# Patient Record
Sex: Female | Born: 1995 | Race: White | Hispanic: No | Marital: Married | Smoking: Never smoker
Health system: Southern US, Community
[De-identification: ages and names within clinical notes are randomized; demographics above are authoritative.]

---

## 2017-03-12 ENCOUNTER — Other Ambulatory Visit: Payer: Self-pay | Admitting: Family Medicine

## 2017-03-12 DIAGNOSIS — R519 Headache, unspecified: Secondary | ICD-10-CM

## 2017-03-12 DIAGNOSIS — R51 Headache: Principal | ICD-10-CM

## 2017-03-28 ENCOUNTER — Other Ambulatory Visit: Payer: Self-pay

## 2017-06-26 ENCOUNTER — Other Ambulatory Visit: Payer: Self-pay | Admitting: Family Medicine

## 2017-06-26 DIAGNOSIS — N912 Amenorrhea, unspecified: Secondary | ICD-10-CM

## 2017-07-03 ENCOUNTER — Ambulatory Visit
Admission: RE | Admit: 2017-07-03 | Discharge: 2017-07-03 | Disposition: A | Discharge status: Home / Self Care | Payer: 59 | Source: Ambulatory Visit | Attending: Family Medicine | Admitting: Family Medicine

## 2017-07-03 DIAGNOSIS — N912 Amenorrhea, unspecified: Secondary | ICD-10-CM

## 2017-10-07 ENCOUNTER — Other Ambulatory Visit: Payer: Self-pay | Admitting: Family Medicine

## 2017-10-07 DIAGNOSIS — N83209 Unspecified ovarian cyst, unspecified side: Secondary | ICD-10-CM

## 2017-10-24 ENCOUNTER — Ambulatory Visit
Admission: RE | Admit: 2017-10-24 | Discharge: 2017-10-24 | Disposition: A | Discharge status: Home / Self Care | Payer: 59 | Source: Ambulatory Visit | Attending: Family Medicine | Admitting: Family Medicine

## 2017-10-24 DIAGNOSIS — N83209 Unspecified ovarian cyst, unspecified side: Secondary | ICD-10-CM

## 2019-01-14 ENCOUNTER — Other Ambulatory Visit: Payer: Self-pay

## 2019-01-14 ENCOUNTER — Encounter (HOSPITAL_COMMUNITY): Payer: Self-pay

## 2019-01-14 ENCOUNTER — Emergency Department (HOSPITAL_COMMUNITY)
Admission: EM | Admit: 2019-01-14 | Discharge: 2019-01-14 | Disposition: A | Discharge status: Home / Self Care | Payer: 59 | Attending: Emergency Medicine | Admitting: Emergency Medicine

## 2019-01-14 ENCOUNTER — Emergency Department (HOSPITAL_COMMUNITY): Payer: 59

## 2019-01-14 DIAGNOSIS — R079 Chest pain, unspecified: Secondary | ICD-10-CM | POA: Insufficient documentation

## 2019-01-14 DIAGNOSIS — Z5321 Procedure and treatment not carried out due to patient leaving prior to being seen by health care provider: Secondary | ICD-10-CM | POA: Diagnosis not present

## 2019-01-14 LAB — TROPONIN I (HIGH SENSITIVITY): Troponin I (High Sensitivity): <2 ng/L (ref <18)

## 2019-01-14 LAB — CBC
HCT: 41.4 % (ref 36.0–46.0)
Hemoglobin: 13.4 g/dL (ref 12.0–15.0)
MCH: 29.6 pg (ref 26.0–34.0)
MCHC: 32.4 g/dL (ref 30.0–36.0)
MCV: 91.6 fL (ref 80.0–100.0)
Platelets: 349 10*3/uL (ref 150–400)
RBC: 4.52 MIL/uL (ref 3.87–5.11)
RDW: 12.6 % (ref 11.5–15.5)
WBC: 9.3 10*3/uL (ref 4.0–10.5)
nRBC: 0 % (ref 0.0–0.2)

## 2019-01-14 LAB — BASIC METABOLIC PANEL
Anion gap: 8 (ref 5–15)
BUN: 11 mg/dL (ref 6–20)
CO2: 24 mmol/L (ref 22–32)
Calcium: 9.1 mg/dL (ref 8.9–10.3)
Chloride: 108 mmol/L (ref 98–111)
Creatinine, Ser: 0.67 mg/dL (ref 0.44–1.00)
GFR calc Af Amer: >60 mL/min (ref >60)
GFR calc non Af Amer: >60 mL/min (ref >60)
Glucose, Bld: 81 mg/dL (ref 70–99)
Potassium: 3.9 mmol/L (ref 3.5–5.1)
Sodium: 140 mmol/L (ref 135–145)

## 2019-01-14 LAB — I-STAT BETA HCG BLOOD, ED (MC, WL, AP ONLY): I-stat hCG, quantitative: <5 m[IU]/mL (ref <5)

## 2019-01-14 MED ORDER — SODIUM CHLORIDE 0.9% FLUSH: 3.0000 mL | Freq: Once | INTRAVENOUS | Status: DC

## 2019-01-14 NOTE — ED Triage Notes (Signed)
Pt reports central to right sided chest pressure, radiates to her back that started this morning. Worse with deep inspiration. No medical hx. Resp e/u

## 2019-01-14 NOTE — ED Notes (Signed)
Pt stated asked about wait and results.  I mentioned that there was nothing emergent about her results.  Pt decided to leve.

## 2021-07-25 ENCOUNTER — Telehealth: Payer: Self-pay | Admitting: Family Medicine

## 2021-07-25 NOTE — Telephone Encounter (Signed)
Okay to accept.

## 2021-07-25 NOTE — Telephone Encounter (Signed)
Okay to see pt?

## 2021-07-25 NOTE — Telephone Encounter (Signed)
Patient would like to know if it's okay for her to establish care as a new patient with [facility]? Patient's mom (MRN: [id number]) and Lurline Del (MRN: [id number]) see's [facility].

## 2021-07-29 NOTE — Progress Notes (Addendum)
[facility] [address] [place], [place] [address] [phone]  Date:  08/01/2021   Name:  Jennifer Yoder   DOB:  [birth year]   MRN:  [id number]  PCP:  Pearline Cables, MD    Chief Complaint: New Patient (Initial Visit) (Concerns/ questions: 1. Pt says she has not feeling herself since christmas. 2. Seeing eye drs for elevated eye pressure. /HPV: id not receive /HIV screen/ Hep C screen/Tdap- utd per pt/Flu- has not received/Pap- sees gyn)   History of Present Illness:  Jennifer Yoder is a 26 y.o. very pleasant female patient who presents with the following:  New patient seen today to establish care Her mother and grandmother are patients of mine  She was seen by her eye doctor recently and was told that she had possibly increased intracranial pressure. 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[employer]-they examined her on February 9 and noted findings consistent with papilledema  She previously went to urgent care in January due to current symptoms and was told that perhaps she might have an ear infection- they treated her with abx and pred, no help  She has noted a headache and feels off balance She has noted this for a couple of months-this is why she went to urgent care  She may feel like she gets a hot flash off and on   She does see GYN for her pap   She is being seen by [facility] in 2 days    She is engaged to be married- they are thinking of getting married next fall She enjoys cooking, being with her fianc 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 She had a bleed at the beginning of this month but it was only 2-3 days  Results for orders placed or performed in visit on 08/01/21  POCT urine pregnancy  Result Value Ref Range   Preg Test, Ur Negative Negative     There are no problems to display  for this patient.   No past medical history on file.  No past surgical history on file.  Social History   Tobacco Use   Smoking status: Never   Smokeless tobacco: Never  Substance Use Topics   Alcohol use: Not Currently   Drug use: Never    No family history on file.  Not on File  Medication list has been reviewed and updated.  No current outpatient medications on file prior to visit.   No current facility-administered medications on file prior to visit.    Review of Systems:  As per HPI- otherwise negative.   Physical Examination: Vitals:   08/01/21 1037  BP: 102/62  Pulse: (!) 102  Resp: 18  Temp: 97.7 F (36.5 C)  SpO2: 99%   Vitals:   08/01/21 1037  Weight: (!) 320 lb (145.2 kg)  Height: 5\' 2"  (1.575 m)   Body mass index is 58.53 kg/m. Ideal Body Weight: Weight in (lb) to have BMI = 25: 136.4  GEN: no acute distress.  Obese, otherwise looks well HEENT: Atraumatic, Normocephalic.  Bilateral TM wnl, oropharynx normal.  PEERL,EOMI.   Ears and Nose: No external deformity. CV: RRR, No M/G/R. No JVD. No thrill. No extra heart sounds. PULM: CTA B, no wheezes, crackles, rhonchi. No retractions. No resp. distress. No accessory muscle use.  ABD: S, NT, ND, +BS. No rebound. No HSM. EXTR: No c/c/e PSYCH: Normally interactive. Conversant.   Results for orders placed or performed in visit on 08/01/21  POCT urine pregnancy  Result Value Ref Range   Preg Test, Ur Negative Negative    Assessment and Plan: Papilledema - Plan: Ambulatory referral to Neurology, MR Brain W Wo Contrast  Irregular menses - Plan: POCT urine pregnancy  Screening for hyperlipidemia - Plan: Lipid panel  Screening, deficiency anemia, iron - Plan: CBC  Screening for diabetes mellitus - Plan: Comprehensive metabolic panel, Hemoglobin A1c  Fatigue, unspecified type - Plan: TSH, VITAMIN D 25 Hydroxy (Vit-D Deficiency, Fractures)  Encounter for initial prescription of contraceptive  pills  New daily persistent headache - Plan: MR Brain W Wo Contrast  Change in vision - Plan: MR Brain W Wo Contrast  Patient seen today to establish care.  She has noticed symptoms of headache, vision change and generally not feeling great for a couple of months.  She was recently seen by optometry and noted to have possible papilledema.  We are concerned this could indicate idiopathic intracranial hypertension  I have made a referral to neurology, ordered an MRI of her brain  Routine lab work otherwise pending as above  She is also interested in contraception-we discussed use of hormonal contraceptives while we are working up possible IIH.  Spoke with pharmD- POP for contraception is a reasonable choice and safer than pregnancy for her at this time   We will send patient a message with this information, will offer progesterone only pill  Signed Abbe Amsterdam, MD  Addendum 2/16, received labs as below.  Message to patient Results for orders placed or performed in visit on 08/01/21  CBC  Result Value Ref Range   WBC 6.6 4.0 - 10.5 K/uL   RBC 4.45 3.87 - 5.11 Mil/uL   Platelets 303.0 150.0 - 400.0 K/uL   Hemoglobin 13.3 12.0 - 15.0 g/dL   HCT 16.1 09.6 - 04.5 %   MCV 89.5 78.0 - 100.0 fl   MCHC 33.3 30.0 - 36.0 g/dL   RDW 40.9 81.1 - 91.4 %  Comprehensive metabolic panel  Result Value Ref Range   Sodium 139 135 - 145 mEq/L   Potassium 4.3 3.5 - 5.1 mEq/L   Chloride 104 96 - 112 mEq/L   CO2 28 19 - 32 mEq/L   Glucose, Bld 111 (H) 70 - 99 mg/dL   BUN 12 6 - 23 mg/dL   Creatinine, Ser 7.82 0.40 - 1.20 mg/dL   Total Bilirubin 0.3 0.2 - 1.2 mg/dL   Alkaline Phosphatase 61 39 - 117 U/L   AST 15 0 - 37 U/L   ALT 15 0 - 35 U/L   Total Protein 7.8 6.0 - 8.3 g/dL   Albumin 4.6 3.5 - 5.2 g/dL   GFR 956.21 >30.86 mL/min   Calcium 9.4 8.4 - 10.5 mg/dL  Hemoglobin V7Q  Result Value Ref Range   Hgb A1c MFr Bld 5.5 4.6 - 6.5 %  Lipid panel  Result Value Ref Range   Cholesterol  172 0 - 200 mg/dL   Triglycerides 46.9 0.0 - 149.0 mg/dL   HDL 62.95 (L) >28.41 mg/dL   VLDL 32.4 0.0 - 40.1 mg/dL   LDL Cholesterol 027 (H) 0 - 99 mg/dL   Total CHOL/HDL Ratio 5    NonHDL 136.00   TSH  Result Value Ref Range   TSH 2.32 0.35 - 5.50 uIU/mL  VITAMIN D 25  Hydroxy (Vit-D Deficiency, Fractures)  Result Value Ref Range   VITD 15.62 (L) 30.00 - 100.00 ng/mL  POCT urine pregnancy  Result Value Ref Range   Preg Test, Ur Negative Negative

## 2021-08-01 ENCOUNTER — Encounter: Payer: Self-pay | Admitting: Family Medicine

## 2021-08-01 ENCOUNTER — Ambulatory Visit (INDEPENDENT_AMBULATORY_CARE_PROVIDER_SITE_OTHER): Payer: Managed Care, Other (non HMO) | Admitting: Family Medicine

## 2021-08-01 VITALS — BP 102/62 | HR 102 | Temp 97.7°F | Resp 18 | Ht 62.0 in | Wt 320.0 lb

## 2021-08-01 DIAGNOSIS — Z130 Encounter for screening for diseases of the blood and blood-forming organs and certain disorders involving the immune mechanism: Secondary | ICD-10-CM | POA: Diagnosis not present

## 2021-08-01 DIAGNOSIS — H4710 Unspecified papilledema: Secondary | ICD-10-CM

## 2021-08-01 DIAGNOSIS — E559 Vitamin D deficiency, unspecified: Secondary | ICD-10-CM

## 2021-08-01 DIAGNOSIS — H539 Unspecified visual disturbance: Secondary | ICD-10-CM

## 2021-08-01 DIAGNOSIS — Z131 Encounter for screening for diabetes mellitus: Secondary | ICD-10-CM

## 2021-08-01 DIAGNOSIS — G4452 New daily persistent headache (NDPH): Secondary | ICD-10-CM

## 2021-08-01 DIAGNOSIS — R5383 Other fatigue: Secondary | ICD-10-CM | POA: Diagnosis not present

## 2021-08-01 DIAGNOSIS — Z13220 Encounter for screening for lipoid disorders: Secondary | ICD-10-CM

## 2021-08-01 DIAGNOSIS — N926 Irregular menstruation, unspecified: Secondary | ICD-10-CM

## 2021-08-01 DIAGNOSIS — Z30011 Encounter for initial prescription of contraceptive pills: Secondary | ICD-10-CM

## 2021-08-01 LAB — CBC
HCT: 39.9 % (ref 36.0–46.0)
Hemoglobin: 13.3 g/dL (ref 12.0–15.0)
MCHC: 33.3 g/dL (ref 30.0–36.0)
MCV: 89.5 fl (ref 78.0–100.0)
Platelets: 303 10*3/uL (ref 150.0–400.0)
RBC: 4.45 Mil/uL (ref 3.87–5.11)
RDW: 13.9 % (ref 11.5–15.5)
WBC: 6.6 10*3/uL (ref 4.0–10.5)

## 2021-08-01 LAB — HEMOGLOBIN A1C: Hgb A1c MFr Bld: 5.5 % (ref 4.6–6.5)

## 2021-08-01 LAB — VITAMIN D 25 HYDROXY (VIT D DEFICIENCY, FRACTURES): VITD: 15.62 ng/mL — ABNORMAL LOW (ref 30.00–100.00)

## 2021-08-01 LAB — POCT URINE PREGNANCY: Preg Test, Ur: NEGATIVE

## 2021-08-01 LAB — TSH: TSH: 2.32 u[IU]/mL (ref 0.35–5.50)

## 2021-08-01 NOTE — Patient Instructions (Signed)
Good to meet you today!  Take care and I will be in touch with your labs asap I think we can use a non- estrogen birth control pill for you- however I would like to do a bit more reading and speak with the pharmacy first.  I will be in touch with you  Please let me know what Dr Baird Cancer says at your appt on Friday I will also touch base with neurology for you and make a referral

## 2021-08-02 ENCOUNTER — Encounter: Payer: Self-pay | Admitting: Family Medicine

## 2021-08-02 LAB — LIPID PANEL
Cholesterol: 172 mg/dL (ref 0–200)
HDL: 36.2 mg/dL — ABNORMAL LOW (ref >39.00)
LDL Cholesterol: 117 mg/dL — ABNORMAL HIGH (ref 0–99)
NonHDL: 136
Total CHOL/HDL Ratio: 5
Triglycerides: 97 mg/dL (ref 0.0–149.0)
VLDL: 19.4 mg/dL (ref 0.0–40.0)

## 2021-08-02 LAB — COMPREHENSIVE METABOLIC PANEL
ALT: 15 U/L (ref 0–35)
AST: 15 U/L (ref 0–37)
Albumin: 4.6 g/dL (ref 3.5–5.2)
Alkaline Phosphatase: 61 U/L (ref 39–117)
BUN: 12 mg/dL (ref 6–23)
CO2: 28 mEq/L (ref 19–32)
Calcium: 9.4 mg/dL (ref 8.4–10.5)
Chloride: 104 mEq/L (ref 96–112)
Creatinine, Ser: 0.65 mg/dL (ref 0.40–1.20)
GFR: 122.16 mL/min (ref >60.00)
Glucose, Bld: 111 mg/dL — ABNORMAL HIGH (ref 70–99)
Potassium: 4.3 mEq/L (ref 3.5–5.1)
Sodium: 139 mEq/L (ref 135–145)
Total Bilirubin: 0.3 mg/dL (ref 0.2–1.2)
Total Protein: 7.8 g/dL (ref 6.0–8.3)

## 2021-08-02 MED ORDER — VITAMIN D3 1.25 MG (50000 UT) PO CAPS: ORAL_CAPSULE | ORAL | 0 refills | Status: DC

## 2021-08-02 NOTE — Addendum Note (Signed)
Addended by: Lamar Blinks C on: 08/02/2021 11:01 AM   Modules accepted: Orders

## 2021-08-03 ENCOUNTER — Encounter: Payer: Self-pay | Admitting: Family Medicine

## 2021-08-03 DIAGNOSIS — H4710 Unspecified papilledema: Secondary | ICD-10-CM

## 2021-08-04 ENCOUNTER — Encounter: Payer: Self-pay | Admitting: Family Medicine

## 2021-08-04 NOTE — Telephone Encounter (Signed)
I called pt back yesterday- the ophthalmologist she saw indicate that she did have papilledema, but did not feel her vision was at immediate risk.  The MD recommended that she do a brain MRI and neurology referral which are  already in motion.   We also discussed getting an LP- explained that her LP "opening pressure" is essential to making the diagnosis of IIH.  As such, she would like to go ahead and get LP ordered which I have done.    Cautioned pt that is she is getting worse please go to the ER.  This evaluation will take some time as there are several steps- therefore if she starts to decline please seek immediate care.  She states understanding

## 2021-08-06 ENCOUNTER — Telehealth: Payer: Self-pay | Admitting: Family Medicine

## 2021-08-06 ENCOUNTER — Telehealth: Payer: Self-pay

## 2021-08-06 ENCOUNTER — Other Ambulatory Visit: Payer: Self-pay | Admitting: Family Medicine

## 2021-08-06 DIAGNOSIS — R519 Headache, unspecified: Secondary | ICD-10-CM

## 2021-08-06 NOTE — Telephone Encounter (Signed)
Authorization number: [id number]  Dates approved: 08/01/21 through 01/28/2022

## 2021-08-06 NOTE — Telephone Encounter (Signed)
-----   Message from Darreld Mclean, MD sent at 08/01/2021  8:18 PM EST ----- Checking with neurology,?  Start Diamox

## 2021-08-07 NOTE — Telephone Encounter (Signed)
Via fax 08/07/21:   Approval number: [id number]  Dates: 08/06/21 through 02/02/2022

## 2021-08-07 NOTE — Telephone Encounter (Signed)
Opened in error

## 2021-08-08 ENCOUNTER — Encounter: Payer: Self-pay | Admitting: Family Medicine

## 2021-08-08 NOTE — Telephone Encounter (Signed)
Called patient back, she was just surprised that her MRI estimate is $1200.  I advised her we can try a different facility such as [facility], might be less expensive.  However she really would like to go ahead and get her MRI done, plans to go through with it tomorrow  She also has a neurology appointment pending March 1

## 2021-08-08 NOTE — Telephone Encounter (Signed)
error 

## 2021-08-09 ENCOUNTER — Ambulatory Visit (HOSPITAL_COMMUNITY)
Admission: RE | Admit: 2021-08-09 | Discharge: 2021-08-09 | Disposition: A | Discharge status: Home / Self Care | Payer: Commercial Managed Care - HMO | Source: Ambulatory Visit | Attending: Family Medicine | Admitting: Family Medicine

## 2021-08-09 ENCOUNTER — Other Ambulatory Visit: Payer: Self-pay

## 2021-08-09 ENCOUNTER — Encounter: Payer: Self-pay | Admitting: Family Medicine

## 2021-08-09 DIAGNOSIS — R519 Headache, unspecified: Secondary | ICD-10-CM | POA: Insufficient documentation

## 2021-08-09 MED ORDER — AMOXICILLIN 500 MG PO CAPS: 1000.0000 mg | ORAL_CAPSULE | Freq: Two times a day (BID) | ORAL | 0 refills | Status: DC

## 2021-08-09 MED ORDER — GADOBUTROL 1 MMOL/ML IV SOLN
10.0000 mL | Freq: Once | INTRAVENOUS | Status: AC | PRN
  Administered 2021-08-09: 10 mL via INTRAVENOUS

## 2021-08-09 NOTE — Telephone Encounter (Signed)
Received her MRI as follows MR Brain W Wo Contrast  Result Date: 08/09/2021 CLINICAL DATA:  Acute non-intractable headache, unspecified headache type. Headache, papilledema. EXAM: MRI HEAD WITHOUT AND WITH CONTRAST TECHNIQUE: Multiplanar, multiecho pulse sequences of the brain and surrounding structures were obtained without and with intravenous contrast. CONTRAST:  84mL GADAVIST GADOBUTROL 1 MMOL/ML IV SOLN COMPARISON:  No pertinent prior exams available for comparison. FINDINGS: Brain: Cerebral volume is normal. Punctate focus of susceptibility weighted signal loss within the left basal ganglia, which may reflect a focus of mineralization or chronic microhemorrhage (series 10, image 21). No cortical encephalomalacia is identified. No significant cerebral white matter disease. There is no acute infarct. No evidence of an intracranial mass. No extra-axial fluid collection. No midline shift. No pathologic intracranial enhancement identified. Vascular: Maintained flow voids within the proximal large arterial vessels. Skull and upper cervical spine: No focal suspicious marrow lesion. Sinuses/Orbits: Visualized orbits show no acute finding. 2.3 cm mucous retention cyst, and background trace mucosal thickening, within the right maxillary sinus. Minimal mucosal thickening, and possible fluid, within the right ethmoid air cells. Other: Nonspecific prominence of the nasopharyngeal/adenoid soft tissues. IMPRESSION: No evidence of acute intracranial abnormality. Punctate focus of susceptibility-weighted signal loss within the left basal ganglia, which may reflect a focus of mineralization or chronic microhemorrhage. Otherwise unremarkable MRI appearance of the brain. Paranasal sinus disease, as described. Nonspecific prominence of the nasopharyngeal/adenoid soft tissues. Electronically Signed   By: Jackey Loge D.O.   On: 08/09/2021 11:06    Called patient to discuss.  No findings of IIH on her MRI, but this does not  rule out the condition.  I have spoken with Olegario Messier at [facility] imaging about her lumbar puncture, we are working on getting this done but I am afraid it may not be until next week  She is seeing Dr. Ocie Doyne at [facility] neuro next week.  Sent her a message asking her opinion regarding when her LP needs to be done and can restart acetazolamide in the meantime  We will also send in a prescription for amoxicillin for sinus disease as noted above  The patient also mentions she has intermittent chest pain, no shortness of breath.  I offered to bring her in to see 1 my partners and have this evaluated tomorrow.  For the time being she declines, she will let us know if is getting worse.  She thinks it may be due to anxiety

## 2021-08-10 MED ORDER — CEFDINIR 300 MG PO CAPS: 300.0000 mg | ORAL_CAPSULE | Freq: Two times a day (BID) | ORAL | 0 refills | Status: DC

## 2021-08-10 NOTE — Addendum Note (Signed)
Addended by: Abbe Amsterdam C on: 08/10/2021 11:52 AM   Modules accepted: Orders

## 2021-08-11 ENCOUNTER — Encounter: Payer: Self-pay | Admitting: Family Medicine

## 2021-08-13 ENCOUNTER — Encounter: Payer: Self-pay | Admitting: Family Medicine

## 2021-08-15 ENCOUNTER — Other Ambulatory Visit: Payer: Managed Care, Other (non HMO)

## 2021-08-15 ENCOUNTER — Telehealth: Payer: Self-pay

## 2021-08-15 ENCOUNTER — Ambulatory Visit: Payer: Managed Care, Other (non HMO) | Admitting: Psychiatry

## 2021-08-15 NOTE — Progress Notes (Signed)
? ?Referring:  ?[facility], Gwenlyn Found, MD ?[address] ?[address] ?[place],  [place] [address] ? ?PCP: ?[facility], Gwenlyn Found, MD ? ?Neurology was asked to evaluate Jennifer Yoder, a 26 year old female for a chief complaint of headaches.  Our recommendations of care will be communicated by shared medical record.   ? ?CC:  headaches ? ?HPI:  ?Medical co-morbidities: none ? ?The patient presents for evaluation of headaches which have been present for the past 6-7 years, around the time she started birth control. She stopped birth control but continued to have headaches. Since Christmas she has headaches every day. States she had an upper respiratory infection at that time. Was treated with antibiotics but this did not make a difference. She wakes up with a headache and will take Excedrin every day. Lately her headaches have been bifrontal. She has had constant blurred vision and floaters in her vision since her headaches became daily. ? ?She saw ophthalmology who noted papilledema on her fundus exam. MRI brain 08/09/21 showed no acute abnormality. Lumbar puncture was ordered but has not yet been scheduled. ? ?Headache History: ?Onset: Christmas 2022 ?Triggers: stress, coughing ?Aura: floaters, blurred vision ?Location: bifrontal ?Quality/Description: dull ache ?Associated Symptoms: ? Photophobia: yes ? Phonophobia: yes ? Nausea: yes ?Vomiting: yes ?+tinnitus (ringing) ?Worse with activity?: yes ?Duration of headaches: constant unless she takes Excedrin ? ?Headache days per month: 30 ?Headache free days per month: 0 ? ?Current Treatment: ?Abortive ?Excedrin ? ?Preventative ?none ? ?Prior Therapies                                 ?Excedrin ? ? ?LABS: ?TSH 08/01/21: 2.53 ?CBC ?   ?Component Value Date/Time  ? WBC 6.6 08/01/2021 1114  ? RBC 4.45 08/01/2021 1114  ? HGB 13.3 08/01/2021 1114  ? HCT 39.9 08/01/2021 1114  ? PLT 303.0 08/01/2021 1114  ? MCV 89.5 08/01/2021 1114  ? MCH 29.6 01/14/2019 1622  ? MCHC 33.3 08/01/2021 1114   ? RDW 13.9 08/01/2021 1114  ? ?CMP Latest Ref Rng & Units 08/01/2021 01/14/2019  ?Glucose 70 - 99 mg/dL 191(Y) 81  ?BUN 6 - 23 mg/dL 12 11  ?Creatinine 0.40 - 1.20 mg/dL 7.82 9.56  ?Sodium 135 - 145 mEq/L 139 140  ?Potassium 3.5 - 5.1 mEq/L 4.3 3.9  ?Chloride 96 - 112 mEq/L 104 108  ?CO2 19 - 32 mEq/L 28 24  ?Calcium 8.4 - 10.5 mg/dL 9.4 9.1  ?Total Protein 6.0 - 8.3 g/dL 7.8 -  ?Total Bilirubin 0.2 - 1.2 mg/dL 0.3 -  ?Alkaline Phos 39 - 117 U/L 61 -  ?AST 0 - 37 U/L 15 -  ?ALT 0 - 35 U/L 15 -  ? ? ? ?IMAGING:  ?08/09/21 MRI brain with contrast: no acute intracranial abnormality ? ?Imaging independently reviewed on August 16, 2021  ? ?Current Outpatient Medications on File Prior to Visit  ?Medication Sig Dispense Refill  ? cefdinir (OMNICEF) 300 MG capsule Take 1 capsule (300 mg total) by mouth 2 (two) times daily. 20 capsule 0  ? Cholecalciferol (VITAMIN D3) 1.25 MG (50000 UT) CAPS Take 1 weekly for 12 weeks 12 capsule 0  ? ?No current facility-administered medications on file prior to visit.  ? ? ? ?Allergies: ?Not on File ? ?Family History: ?Migraine or other headaches in the family:  none ?Aneurysms in a first degree relative:  none ?Brain tumors in the family:  none ?Other neurological illness in the family:   none ? ?Past Medical History: ?History reviewed. No pertinent past medical history. ? ?Past Surgical History ?History reviewed. No pertinent surgical history. ? ?Social History: ?Social History  ? ?Tobacco Use  ? Smoking status: Never  ? Smokeless tobacco: Never  ?Substance Use Topics  ? Alcohol use: Not Currently  ? Drug use: Never  ? ? ?ROS: ?Negative for fevers, chills. Positive for headaches, blurred vision. All other systems reviewed and negative unless stated otherwise in HPI. ? ? ?Physical Exam:  ? ?Vital Signs: ?BP 134/86   Pulse 83   Ht 5\' 2"  (1.575 m)   Wt 231 lb (104.8 kg)   LMP  (LMP Unknown)   BMI 42.25 kg/m?  ?GENERAL: well appearing,in no acute distress,alert ?SKIN:  Color, texture, turgor  normal. No rashes or lesions ?HEAD:  Normocephalic/atraumatic. ?CV:  RRR ?RESP: Normal respiratory effort ?MSK: no tenderness to palpation over occiput, neck, or shoulders ? ?NEUROLOGICAL: ?Mental Status: Alert, oriented to person, place and time,Follows commands ?Cranial Nerves: PERRL, blurred disc margins OU, visual fields intact to confrontation, extraocular movements intact, facial sensation intact, no facial droop or ptosis, hearing grossly intact, no dysarthria ?Motor: muscle strength 5/5 both upper and lower extremities,no drift, normal tone ?Reflexes: 2+ throughout ?Sensation: intact to light touch all 4 extremities ?Coordination: Finger-to- nose-finger intact bilaterally ?Gait: normal-based ? ? ?IMPRESSION: ?26 year old female who presents for evaluation of new daily headaches and blurred vision. MRI brain showed no acute process. Ophthalmologic exam was positive for papilledema. Will order CTV to rule out CVST. Lumbar puncture is already ordered and she will call today to get this scheduled. Advised patient to present to the ED if she experiences significant vision loss or worsening headaches while workup is pending. ? ?PLAN: ?-CT venogram ?-Lumbar puncture to measure opening pressure ?-Will plan to start Diamox if pressure elevated on LP ? ?I spent a total of 30 minutes chart reviewing and counseling the patient. Headache education was done. Discussed medication side effects, adverse reactions and drug interactions. Written educational materials and patient instructions outlining all of the above were given. ? ?Follow-up: 3 months ? ? ?Genia Harold, MD ?08/16/2021   ?8:55 AM ? ? ?

## 2021-08-15 NOTE — Telephone Encounter (Signed)
Reason for Call Request to speak to Physician ?Initial Comment Caller states this is Dr. Shea Evans. Dr. Patsy Lager requested for her to call. ?Additional Comment Dr. will call tomorrow since she is leaving the office. ?Patient Name Jennifer Yoder ?Patient DOB July 15, 1995 ?Requesting Provider Dr. Shea Evans ?Physician Number [phone] ?Facility Name [facility] ?Room Number N/A ?Disp. Time Disposition Final User ?08/14/2021 5:11:31 PM Send to [facility] Paging Queue Sandra Cockayne ?08/14/2021 5:13:43 PM Called Office Relayed Information Jake Seats ?08/14/2021 5:14:11 PM Page Completed Yes Jake Seats ?Paging ?DoctorName Phone DateTime Result/Outcome Message Type Notes ?Abbe Amsterdam- MD [phone] 08/14/2021 ?5:13:43 PM ?Called Office ?- Relayed ?Information ?Doctor Paged ?Abbe Amsterdam- MD 08/14/2021 ?5:14:04 PM ?Spoke with Office ?- General Message Result sent as fax to office. ?Call Closed By: Jake Seats ?Transaction Date/Time: 08/14/2021 5:06:03 PM (ET ?

## 2021-08-15 NOTE — Telephone Encounter (Signed)
I called and spoke with Dr. Shea Evans today.  They are monitoring Jennifer Yoder's optic nerve swelling.  We will plan to have her see neurology tomorrow, defer to neurology opinion regarding LP ?

## 2021-08-16 ENCOUNTER — Ambulatory Visit: Payer: Managed Care, Other (non HMO) | Admitting: Psychiatry

## 2021-08-16 ENCOUNTER — Encounter: Payer: Self-pay | Admitting: Psychiatry

## 2021-08-16 ENCOUNTER — Telehealth: Payer: Self-pay | Admitting: Psychiatry

## 2021-08-16 VITALS — BP 134/86 | HR 83 | Ht 62.0 in | Wt 231.0 lb

## 2021-08-16 DIAGNOSIS — H4710 Unspecified papilledema: Secondary | ICD-10-CM | POA: Diagnosis not present

## 2021-08-16 DIAGNOSIS — R519 Headache, unspecified: Secondary | ICD-10-CM | POA: Diagnosis not present

## 2021-08-16 NOTE — Patient Instructions (Addendum)
CT scan of veins in the brain ?Schedule spinal tap ? ?What is idiopathic intracranial hypertension? ?Idiopathic intracranial hypertension is a condition that causes pressure inside the skull. It is also called "pseudotumor cerebri." ?Idiopathic intracranial hypertension causes headaches and vision loss. ? ?What causes idiopathic intracranial hypertension? ?Doctors do not know the cause. But idiopathic intracranial hypertension is more common in females and people who have obesity. ?Certain medicines seem to make some people more likely to get idiopathic intracranial hypertension. These medicines include tetracycline, high doses of vitamin A, and growth hormone. ?What are the symptoms of idiopathic intracranial hypertension? ?The symptoms include: ??Bad headaches - Some people say the worst pain is right behind their eyes. ??Short periods of vision loss - This can happen in 1 or both eyes. It usually lasts a few seconds and might happen once in a while or several times a day. ??Dimming of vision ??Trouble seeing things at the edge of your line of sight ??Double vision ??Seeing flashing lights ??Noises inside your head - The noise might sound like rushing water or wind. It often pulses in time with your heartbeat and can come and go. ?In rare cases, people with idiopathic intracranial hypertension lose their vision forever. ? ?Will I need tests? ?Yes. Tests can include: ??Eye exam - An eye doctor will use special tools to look for swelling at the back of your eye, near the optic nerve (figure 1). Most people with idiopathic intracranial hypertension have swelling of the optic nerve. The optic nerve connects the eye to the brain. ??Visual field test - This test checks how well you can see things that are at the edges of your line of sight. The test will be repeated from time to time to check your optic nerves. ??MRI or CT scan - These are imaging tests that take pictures of the inside of your brain. Your doctor can use  them to check if a tumor or other problem is causing your symptoms. ??Lumbar puncture - During this procedure, a doctor puts a needle into your lower back to measure the fluid pressure inside your skull. A lumbar puncture is sometimes called a "spinal tap." ? ?How is idiopathic intracranial hypertension treated? ?Treatments include: ??Weight loss - If you are overweight, your doctor will recommend healthy ways to lose weight. If you are very overweight and cannot lose weight through changing your diet and exercise habits, your doctor might recommend medicines or weight-loss surgery. ??Medicines - Your doctor might prescribe medicines that help lower the amount of spinal fluid your body makes. Spinal fluid is the fluid that surrounds the brain and spinal cord. They might also recommend medicines used to prevent and treat headaches. ??Surgery - Doctors only do surgery if losing weight and taking medicines don't help enough. The kinds of surgery include: ?Shunting - In this surgery, a doctor puts a device called a "shunt" into a fluid-filled space inside your brain. The shunt is connected to a tube that is placed under your skin and that empties into your belly. The shunt helps drain the extra spinal fluid from your brain and can relieve the pressure. ?Optical nerve sheath fenestration - In this surgery, a doctor cuts a tiny, window-like hole in the tissue that covers the optic nerve. This helps lower pressure on the nerve to help save your vision. ?

## 2021-08-16 NOTE — Telephone Encounter (Signed)
Rutherford Nail: [id number] (exp. 08/16/21 to 02/12/22) I sent Rinaldo Cloud and Pieter Partridge a message with [facility] to schedule the patient as soon as possible.  ?

## 2021-08-17 ENCOUNTER — Encounter: Payer: Self-pay | Admitting: Family Medicine

## 2021-08-17 ENCOUNTER — Ambulatory Visit
Admission: RE | Admit: 2021-08-17 | Discharge: 2021-08-17 | Disposition: A | Discharge status: Home / Self Care | Payer: Managed Care, Other (non HMO) | Source: Ambulatory Visit | Attending: Family Medicine | Admitting: Family Medicine

## 2021-08-17 DIAGNOSIS — H4710 Unspecified papilledema: Secondary | ICD-10-CM

## 2021-08-17 LAB — PROTEIN, CSF: Total Protein, CSF: 25 mg/dL (ref 15–45)

## 2021-08-17 LAB — CSF CELL COUNT WITH DIFFERENTIAL
RBC Count, CSF: 0 cells/uL
WBC, CSF: 0 cells/uL (ref 0–5)

## 2021-08-17 LAB — GLUCOSE, CSF: Glucose, CSF: 50 mg/dL (ref 40–80)

## 2021-08-17 NOTE — Discharge Instructions (Signed)

## 2021-08-19 ENCOUNTER — Encounter: Payer: Self-pay | Admitting: Psychiatry

## 2021-08-20 ENCOUNTER — Other Ambulatory Visit: Payer: Self-pay | Admitting: Psychiatry

## 2021-08-20 ENCOUNTER — Telehealth: Payer: Self-pay | Admitting: Neurology

## 2021-08-20 MED ORDER — ACETAZOLAMIDE 250 MG PO TABS: ORAL_TABLET | ORAL | 3 refills | Status: DC

## 2021-08-20 MED ORDER — DICLOFENAC POTASSIUM 50 MG PO TABS: 50.0000 mg | ORAL_TABLET | Freq: Two times a day (BID) | ORAL | 0 refills | Status: AC

## 2021-08-20 NOTE — Progress Notes (Signed)
Thanks for sending this over! I'll get in touch with her

## 2021-08-20 NOTE — Telephone Encounter (Signed)
Yes you can recommend those to her. I will send in a short course of diclofenac to try to help manage the headache as well. She should avoid taking other NSAIDs while taking diclofenac

## 2021-08-20 NOTE — Telephone Encounter (Signed)
Late entry: I received an after-hours call message at 5:32 PM yesterday on 08/19/2021.  I was able to talk to the patient.  She reported initially doing well after her lumbar puncture but woke up on 08/19/2021 with a headache.  It was not positional.  She felt it was more in keeping with a migraine, typical to migraines that she has had before.  She took some Excedrin.  She was advised to continue to try to take Excedrin as needed and Tylenol and try to stay well-hydrated and rest.  She was advised that I will send a message to her neurologist, Dr. Billey Gosling.  I reviewed her chart and interim MyChart messages. ?

## 2021-08-21 ENCOUNTER — Telehealth: Payer: Self-pay | Admitting: *Deleted

## 2021-08-21 ENCOUNTER — Telehealth: Payer: Self-pay

## 2021-08-21 ENCOUNTER — Inpatient Hospital Stay: Admission: RE | Admit: 2021-08-21 | Payer: Managed Care, Other (non HMO) | Source: Ambulatory Visit

## 2021-08-21 ENCOUNTER — Encounter: Payer: Self-pay | Admitting: Family Medicine

## 2021-08-21 NOTE — Telephone Encounter (Signed)
Patient called back and stated [facility] and her PCP told her she needs a blood patch. She wants to go to [facility] imaging for it, not go to the ED. I advised her I'll have to send this to Dr Frances Furbish as work in MD and call her back. Patient verbalized understanding, appreciation. ? ?

## 2021-08-21 NOTE — Telephone Encounter (Signed)
Called patient left VM, requesting call back. ?

## 2021-08-21 NOTE — Telephone Encounter (Signed)
Pt has also contacted neurology - I spoke with Dr Frances Furbish who is looking into doing a blood patch for pt although she is not sure yet if this is the appropriate treatment.  Other messages say that I advised pt to get a blood patch which is not true, I have not made any recommendation to pt about post LP headache ?

## 2021-08-21 NOTE — Telephone Encounter (Signed)
Pt returned phone call. Would like a call from the nurse. 

## 2021-08-21 NOTE — Telephone Encounter (Signed)
She can alternate diclofenac and Tylenol for now, continue the current course of good hydration and rest.  Does not sound like a post LP headache which is typically positional and very intense with being upright. ?

## 2021-08-21 NOTE — Telephone Encounter (Signed)
Patient has sent my chart messages re: LP headache. I called her. She's on day #3 s/p LP. On day of LP and next day she felt fine. Headache began Sun, wasn't positional. She has started diclofenac, diamox, is drinking 3 L of water and some caffeine. I reviewed measures of rest, hydration, no other NSAIDS. I advised will let Dr Delena Bali know and call her if any new recommendations. Patient verbalized understanding, appreciation. ? ?

## 2021-08-21 NOTE — Telephone Encounter (Signed)
Pt is having a spinal HA and is needing a blood patch. Worse when standing. Standing up makes her HA unbearable.Triage advised pt to go to the ER since you are out of the office.  ?

## 2021-08-21 NOTE — Telephone Encounter (Signed)
Dr Rexene Alberts spoke with Dr Lorelei Pont who stated she did not tell patient she needed a blood patch. Per Dr Rexene Alberts, called [facility] to ask if they advised patient to get blood patch.  If so Dr Rexene Alberts agreed to give verbal order. I called but  The IR dept is closed. Called patient and informed her of Dr Guadelupe Sabin recommendation and Jone[facility] Caraway is closed. I can call at 8 am. She stated she talked to [place], GI early today and was told she needed a blood patch.  I advised her that if her pain gets worse her option tonight is the ED. She stated she will wait to hear from me in the morning. Patient verbalized understanding, appreciation. ? ?

## 2021-08-21 NOTE — Telephone Encounter (Signed)
Nurse Assessment ?Nurse: Nunzio Cory, RN, Sherrie Date/Time (Eastern Time): 08/21/2021 4:01:15 PM ?Confirm and document reason for call. If ?symptomatic, describe symptoms. ?---Caller states had a lumbar puncture on Friday. States ?HA started Sunday morning when woke up. +fluids. ?States has laid flat for 72 hours. States HA less severe ?when lying down. When got up today HA worse and ?started vomiting. ?Does the patient have any new or worsening ?symptoms? ---Yes ?Will a triage be completed? ---Yes ?Related visit to physician within the last 2 weeks? ---Yes ?Does the PT have any chronic conditions? (i.e. ?diabetes, asthma, this includes High risk factors for ?pregnancy, etc.) ?---No ?Is the patient pregnant or possibly pregnant? (Ask ?all females between the ages of 48-55) ---No ?Is this a behavioral health or substance abuse call? ---No ?Guidelines ?Guideline Title Affirmed Question Affirmed Notes Nurse Date/Time (Eastern ?Time) ?Headache [1] SEVERE ?headache (e.g., ?excruciating) AND ?[2] "worst headache" ?of life ?Nunzio Cory, RN, Sherrie 08/21/2021 4:05:24 PM ?PLEASE NOTE: All timestamps contained within this report are represented as [place] Standard Time. ?CONFIDENTIALTY NOTICE: This fax transmission is intended only for the addressee. It contains information that is legally privileged, confidential or ?otherwise protected from use or disclosure. If you are not the intended recipient, you are strictly prohibited from reviewing, disclosing, copying using ?or disseminating any of this information or taking any action in reliance on or regarding this information. If you have received this fax in error, please ?notify us immediately by telephone so that we can arrange for its return to [place]. Phone: [phone], Toll-Free: [phone], Fax: [phone] ?Page: 2 of 2 ?Call Id: [id number] ?Disp. Time (Eastern ?Time) Disposition Final User ?08/21/2021 4:10:20 PM Go to ED Now (or PCP triage) Yes Nunzio Cory, RN, Sherrie ?Caller  Disagree/Comply Disagree ?Caller Understands Yes ?PreDisposition InappropriateToAsk ?Care Advice Given Per Guideline ?GO TO ED NOW (OR PCP TRIAGE): * IF NO PCP (PRIMARY CARE PROVIDER) SECOND-LEVEL TRIAGE: You need to be ?seen within the next hour. Go to the ED/UCC at _____________ Hospital. Leave as soon as you can. * It is better and safer if another ?adult drives instead of you. ?Comments ?User: Holland Commons, RN Date/Time Lamount Cohen Time): 08/21/2021 4:10:04 PM ?HA pain 10/10. States HA is unbearable. ?User: Holland Commons, RN Date/Time Lamount Cohen Time): 08/21/2021 4:16:07 PM ?Called office and talked with Dr. Patsy Lager nurse and explained situation. States can send a message to Dr. Patsy Lager ?but she will not be back before tomorrow. Agreed with diposition of going to ED. ?User: Holland Commons, RN Date/Time Lamount Cohen Time): 08/21/2021 4:18:26 PM ?While talking with office became disconnected from caller. Attempted to call the caller back but received ?voicemail, left message would try back in 5-10 minutes. ?User: Holland Commons, RN Date/Time Lamount Cohen Time): 08/21/2021 4:19:22 PM ?Caller requesting an order for the blood patch be called in to where she had the LP done so they could do it instead ?of sitting in the ED for hours. ?Referrals ?GO TO FACILITY OTHER - SPECIFY ?

## 2021-08-21 NOTE — Telephone Encounter (Addendum)
Patient returned call, stated she slept all morning, woke up at 1 pm, took shower and was going for a CT scan. She began vomiting and stated she couldn't get scan done. She stated "this is the worst headache she's ever had". I advised she needs to go to Urgent Care or the ED to be evaluated today. A female could be heard in the background, and she stated an Urgent care won't do anything for her. I again stated that she go to the ED for evaluation.  Patient  stated she has done everything we have told her to do but now we are telling her to go to ED? I advised her that because she has done everything the MD has advised and she is in fact feeling worse, the ED is where she needs to go. A female got on the phone and  stated something unclear to me about ED and the call was ended.  ?

## 2021-08-21 NOTE — Telephone Encounter (Signed)
I spoke with Dr. Lorelei Pont, who reached out by phone call.  She was unclear if the patient needs a blood patch and reports that she did not tell patient to go get a blood patch.  She also does not typically order lumbar punctures but wanted to expedite patient's work-up.  I explained to her that it is possible that the patient has a bad migraine, the description and the evolution of her symptoms is not classic for a post-LP headache.  Nevertheless, I told her that we would reach out to [facility] imaging as the patient reported that she talked to somebody at [facility] and that they would do a blood patch and that they needed an order. If this is what they recommend, I can certainly give a verbal order for [facility] imaging to do a blood patch.  ?Of note, we typically try to avoid doing a blood patch as we do not want to "undo" what we did with the lumbar puncture in the first place.  For now, Dr. Lorelei Pont is agreeable that we speak with someone at [facility] imaging to clarify.  I can give a VO for a blood patch, if needed.  ?Ultimately, the patient may have to go to the emergency room to get evaluated for severe escalation of her headache/migraine.  ?

## 2021-08-21 NOTE — Telephone Encounter (Signed)
Called patient and left detailed VM with Dr Teofilo Pod instructions. Left # for questions. ?

## 2021-08-22 ENCOUNTER — Other Ambulatory Visit: Payer: Self-pay

## 2021-08-22 ENCOUNTER — Telehealth: Payer: Self-pay

## 2021-08-22 ENCOUNTER — Ambulatory Visit
Admission: RE | Admit: 2021-08-22 | Discharge: 2021-08-22 | Disposition: A | Discharge status: Home / Self Care | Payer: Managed Care, Other (non HMO) | Source: Ambulatory Visit | Attending: Neurology | Admitting: Neurology

## 2021-08-22 DIAGNOSIS — R519 Headache, unspecified: Secondary | ICD-10-CM

## 2021-08-22 MED ORDER — IOPAMIDOL (ISOVUE-M 200) INJECTION 41%
1.0000 mL | Freq: Once | INTRAMUSCULAR | Status: AC
  Administered 2021-08-22: 1 mL via EPIDURAL

## 2021-08-22 NOTE — Telephone Encounter (Signed)
Pt called into [facility] 08/21/21 complaining about a Headache that started 08/19/21 after she had an LP done on 08/17/21. Pt states she had no headache after the LP until waking up Sunday morning. However, the patient reports it "may get a little better" when lying down. Pt denies any vomiting or blurred vision at this time and has no other complaints other than a horrible headache. I advised the patient to contact her neurologist and let them know what it going on that she could have a spinal headache and may need a blood patch. However, I also explained to the patient that most spinal headaches are very positional and improve greatly upon lying down and become much worse upon sitting up. The patient stated "it hurts all the time". The patient stated she had been lying flat for over 48 hours and drinking lots of fluids. I advised the patient to continue to do this as well as contact her neurologist for other recommendations/orders. If the patients neurologist feels like a blood patch is needed, we will work this patient in ASAP.  ?

## 2021-08-22 NOTE — Telephone Encounter (Signed)
Called [facility], reached [place] who transferred me to Aleshia's direct line, [phone]. Spoke with Aleshia who stated she spoke with patient this morning. Patient reported she's not had anymore vomiting but headache is still bad. Barnie Del did speak with patient yesterday morning and advised due to reported symptoms she may need a blood patch. Aleshia explained procedure to patient. I advised her Dr Frances Furbish had stated she would agree to order blood patch if Aleshia had reccommended one. Aleshia put in order for Dr Frances Furbish to co sign. I messaged Dr Frances Furbish who agreed to co sign order. Barnie Del will contact patient to schedule it.  ?

## 2021-08-22 NOTE — Progress Notes (Signed)
20 cc of blood drawn from pts RAC by A. Chad, Charity fundraiser for blood patch. Pt tolerated well. Gauze and tape applied after.  ?

## 2021-08-22 NOTE — Discharge Instructions (Signed)

## 2021-08-27 ENCOUNTER — Encounter: Payer: Self-pay | Admitting: Psychiatry

## 2021-09-05 ENCOUNTER — Ambulatory Visit: Payer: Managed Care, Other (non HMO) | Admitting: Psychiatry

## 2021-09-09 NOTE — Progress Notes (Deleted)
Therapist, music at [facility] ?[address], [address] ?[place], [place] [address] ?336 [phone] ?Fax [phone] ? ?Date:  09/12/2021  ? ?Name:  Jennifer Yoder   DOB:  1995-12-23   MRN:  [id number] ? ?PCP:  Jennifer Mclean, MD  ? ? ?Chief Complaint: No chief complaint on file. ? ? ?History of Present Illness: ? ?Jennifer Yoder is a 26 y.o. very pleasant female patient who presents with the following: ? ?Patient is seen today with concern of chest pain ?I met her last month at which time she had papilledema per her eye care professional, concern for intracranial hypertension.  She has been evaluated by neurology who confirmed this diagnosis ? ?After her lumbar puncture she unfortunately had severe persistent headache, she had a blood patch which was helpful ? ?Pap smear ? ?There are no problems to display for this patient. ? ? ?No past medical history on file. ? ?No past surgical history on file. ? ?Social History  ? ?Tobacco Use  ? Smoking status: Never  ? Smokeless tobacco: Never  ?Substance Use Topics  ? Alcohol use: Not Currently  ? Drug use: Never  ? ? ?No family history on file. ? ?No Known Allergies ? ?Medication list has been reviewed and updated. ? ?Current Outpatient Medications on File Prior to Visit  ?Medication Sig Dispense Refill  ? acetaZOLAMIDE (DIAMOX) 250 MG tablet Take 250 mg (1 pill) twice a day for one week, then take 250 mg in AM and 500 mg (2 pills) in PM for one week, then take 500 mg twice a day 120 tablet 3  ? cefdinir (OMNICEF) 300 MG capsule Take 1 capsule (300 mg total) by mouth 2 (two) times daily. 20 capsule 0  ? Cholecalciferol (VITAMIN D3) 1.25 MG (50000 UT) CAPS Take 1 weekly for 12 weeks 12 capsule 0  ? ?No current facility-administered medications on file prior to visit.  ? ? ?Review of Systems: ? ?As per HPI- otherwise negative. ? ? ?Physical Examination: ?There were no vitals filed for this visit. ?There were no vitals filed for this visit. ?There is no height  or weight on file to calculate BMI. ?Ideal Body Weight:   ? ?GEN: no acute distress. ?HEENT: Atraumatic, Normocephalic.  ?Ears and Nose: No external deformity. ?CV: RRR, No M/G/R. No JVD. No thrill. No extra heart sounds. ?PULM: CTA B, no wheezes, crackles, rhonchi. No retractions. No resp. distress. No accessory muscle use. ?ABD: S, NT, ND, +BS. No rebound. No HSM. ?EXTR: No c/c/e ?PSYCH: Normally interactive. Conversant.  ? ? ?Assessment and Plan: ?*** ? ?Signed ?Lamar Blinks, MD ? ?

## 2021-09-12 ENCOUNTER — Ambulatory Visit: Payer: Managed Care, Other (non HMO) | Admitting: Family Medicine

## 2021-09-12 NOTE — Progress Notes (Signed)
Nature conservation officer at [facility] ?[address], [address] ?[place], [place] [address] ?336 [phone] ?Fax [phone] ? ?Date:  09/17/2021  ? ?Name:  Jennifer Yoder   DOB:  1995/07/01   MRN:  [id number] ? ?PCP:  Pearline Cables, MD  ? ? ?Chief Complaint: Chest Pain (Pt thinks this is stress/ anxiety related. Duration is sometimes for hours, sometimes short periods of time. But most of the time when she has a lot going on. Denies any ShOB, lightheadedness, nausea or palpitations. ) ? ? ?History of Present Illness: ? ?Jennifer Yoder is a 26 y.o. very pleasant female patient who presents with the following: ? ?Patient seen today with concern of chest pain ?I saw her as a new patient on February 15, for concern of papilledema and headaches.  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 ?She unfortunately developed a post LP headache and required blood patch ?She is currently taking acetazolamide for intracranial hypertension ? ?BP Readings from Last 3 Encounters:  ?09/17/21 130/80  ?08/22/21 120/77  ?08/17/21 (!) 163/90  ? ?Her vision is overall improved quite a bit and her balance seems much better ?Overall she is feeling much more normal now that she is on diamox  ? ? ?Wt Readings from Last 3 Encounters:  ?09/17/21 227 lb 3.2 oz (103.1 kg)  ?08/16/21 231 lb (104.8 kg)  ?08/01/21 (!) 320 lb (145.2 kg)  ? ?She has noted some chest pains since around Christmas time ?She may feel a discomfort in her anterior chest ?Not SOB ?Might be in the left chest and into the armpit  ?Her cat has been sick recently- she notes that when she felt stressed about the cat her chest would hurt  ?She can also may get sx when she is not stressed ?May occur a few times a week- may last part of a day, may last for several days ?Her apple watch has not noted any abnormality in her HR  ?No nausea or sweating ?It feels good to press on the area  ?Not worse with  exertion ?Most recent episode was 2 days ago, no current chest pain ? ?She notes that her dad has history of some sort of heart issue and HTN ?No other family history of heart issues  ? ?There are no problems to display for this patient. ? ? ?No past medical history on file. ? ?No past surgical history on file. ? ?Social History  ? ?Tobacco Use  ? Smoking status: Never  ? Smokeless tobacco: Never  ?Substance Use Topics  ? Alcohol use: Not Currently  ? Drug use: Never  ? ? ?No family history on file. ? ?No Known Allergies ? ?Medication list has been reviewed and updated. ? ?Current Outpatient Medications on File Prior to Visit  ?Medication Sig Dispense Refill  ? acetaZOLAMIDE (DIAMOX) 250 MG tablet Take 250 mg (1 pill) twice a day for one week, then take 250 mg in AM and 500 mg (2 pills) in PM for one week, then take 500 mg twice a day 120 tablet 3  ? cefdinir (OMNICEF) 300 MG capsule Take 1 capsule (300 mg total) by mouth 2 (two) times daily. 20 capsule 0  ? Cholecalciferol (VITAMIN D3) 1.25 MG (50000 UT) CAPS Take 1 weekly for 12 weeks 12 capsule 0  ? ?No current facility-administered medications on file prior to visit.  ? ? ?Review of Systems: ? ?As per  HPI- otherwise negative. ? ? ?Physical Examination: ?Vitals:  ? 09/17/21 1553  ?BP: 130/80  ?Pulse: 98  ?Resp: 18  ?Temp: 97.6 ?F (36.4 ?C)  ?SpO2: 96%  ? ?Vitals:  ? 09/17/21 1553  ?Weight: 227 lb 3.2 oz (103.1 kg)  ?Height: 5\' 2"  (1.575 m)  ? ?Body mass index is 41.56 kg/m?. ?Ideal Body Weight: Weight in (lb) to have BMI = 25: 136.4 ? ?GEN: no acute distress.  Obese, looks well ?HEENT: Atraumatic, Normocephalic.  ?Ears and Nose: No external deformity. ?CV: RRR, No M/G/R. No JVD. No thrill. No extra heart sounds. ?PULM: CTA B, no wheezes, crackles, rhonchi. No retractions. No resp. distress. No accessory muscle use. ?ABD: S, NT, ND, +BS. No rebound. No HSM. ?EXTR: No c/c/e ?PSYCH: Normally interactive. Conversant.  ? ?EKG: Mild sinus tachycardia with rate of 101.   Possible left atrial enlargement.  Compared with EKG from July 2020 significant changes noted in lead III, likely due to lead placement. ?No evidence of acute ischemia ?Assessment and Plan: ?Chest pain, unspecified type - Plan: EKG 12-Lead, DG Chest 2 View ? ?Anxiety - Plan: hydrOXYzine (ATARAX) 10 MG tablet ? ?Patient seen today with concern of chest pain which seems related to anxiety.  I discussed doing a troponin and echocardiogram.  Patient declines at this time.  No active chest pain, most recent episode was 2 days ago.  She does agree to get a chest x-ray. ?Assuming this is normal she would like to try anxiety medication and see how her symptoms respond.  prescribed hydroxyzine for her to use as needed ?She will let me know if this does not help with her symptoms ? ?Signed ?August 2020, MD ? ?

## 2021-09-14 ENCOUNTER — Telehealth: Payer: Self-pay

## 2021-09-14 NOTE — Telephone Encounter (Signed)
Pending appointment Monday 09/17/21 at 4 pm. ? ?Pt says she thinks this is stress/ anxiety related. Sometimes it happens for hours, sometimes short periods of time. But most of the time when she has a lot going on. Denies any ShOB, lightheadedness, nausea or palpitations.  ?

## 2021-09-14 NOTE — Telephone Encounter (Signed)
Tried calling pt to get more information about her chest pain.  ? ?Left vm to return call.  ? ?

## 2021-09-17 ENCOUNTER — Ambulatory Visit (INDEPENDENT_AMBULATORY_CARE_PROVIDER_SITE_OTHER): Payer: Managed Care, Other (non HMO) | Admitting: Family Medicine

## 2021-09-17 ENCOUNTER — Ambulatory Visit (HOSPITAL_BASED_OUTPATIENT_CLINIC_OR_DEPARTMENT_OTHER)
Admission: RE | Admit: 2021-09-17 | Discharge: 2021-09-17 | Disposition: A | Discharge status: Home / Self Care | Payer: Managed Care, Other (non HMO) | Source: Ambulatory Visit | Attending: Family Medicine | Admitting: Family Medicine

## 2021-09-17 ENCOUNTER — Encounter: Payer: Self-pay | Admitting: Family Medicine

## 2021-09-17 VITALS — BP 130/80 | HR 98 | Temp 97.6°F | Resp 18 | Ht 62.0 in | Wt 227.2 lb

## 2021-09-17 DIAGNOSIS — R079 Chest pain, unspecified: Secondary | ICD-10-CM

## 2021-09-17 DIAGNOSIS — F419 Anxiety disorder, unspecified: Secondary | ICD-10-CM

## 2021-09-17 MED ORDER — HYDROXYZINE HCL 10 MG PO TABS: 10.0000 mg | ORAL_TABLET | Freq: Three times a day (TID) | ORAL | 0 refills | Status: DC | PRN

## 2021-09-17 NOTE — Patient Instructions (Signed)
Good to see you today - I will be in touch with your chest x-ray report ?Try the hydroxyzine as needed for anxiety- let me know if helpful ?If symptoms continue let's get an echocardiogram  ?

## 2021-10-12 ENCOUNTER — Encounter: Payer: Self-pay | Admitting: Family

## 2021-10-12 ENCOUNTER — Ambulatory Visit (INDEPENDENT_AMBULATORY_CARE_PROVIDER_SITE_OTHER): Payer: Managed Care, Other (non HMO) | Admitting: Family

## 2021-10-12 VITALS — BP 138/80 | HR 77 | Temp 98.4°F | Ht 62.0 in | Wt 227.4 lb

## 2021-10-12 DIAGNOSIS — J029 Acute pharyngitis, unspecified: Secondary | ICD-10-CM | POA: Diagnosis not present

## 2021-10-12 LAB — POCT RAPID STREP A (OFFICE): Rapid Strep A Screen: NEGATIVE

## 2021-10-12 MED ORDER — AZITHROMYCIN 250 MG PO TABS: ORAL_TABLET | ORAL | 0 refills | Status: DC

## 2021-10-12 NOTE — Progress Notes (Signed)
?  Jennifer Yoder is a 26 y.o. female with the following history as recorded in EpicCare:  ?There are no problems to display for this patient. ?  ?Current Outpatient Medications  ?Medication Sig Dispense Refill  ? acetaZOLAMIDE (DIAMOX) 250 MG tablet Take 250 mg (1 pill) twice a day for one week, then take 250 mg in AM and 500 mg (2 pills) in PM for one week, then take 500 mg twice a day 120 tablet 3  ? azithromycin (ZITHROMAX) 250 MG tablet 2 tabs po qd x 1 day; 1 tablet per day x 4 days; 6 tablet 0  ? hydrOXYzine (ATARAX) 10 MG tablet Take 1-2 tablets (10-20 mg total) by mouth 3 (three) times daily as needed for anxiety. 40 tablet 0  ? ?No current facility-administered medications for this visit.  ?  ?Allergies: Patient has no known allergies.  ?No past medical history on file.  ?No past surgical history on file.  ?No family history on file.  ?Social History  ? ?Tobacco Use  ? Smoking status: Never  ? Smokeless tobacco: Never  ?Substance Use Topics  ? Alcohol use: Not Currently  ?  ?Subjective:  ?Sore throat x 2 days; no fever but sister tested positive yesterday; is worried about being sick with upcoming engagement party this weekend;  ?Notes that in general, her throat has "felt scratchy" recently;  ? ? ?  ?Objective:  ?Vitals:  ? 10/12/21 1128  ?BP: 138/80  ?Pulse: 77  ?Temp: 98.4 ?F (36.9 ?C)  ?TempSrc: Oral  ?SpO2: 99%  ?Weight: 227 lb 6.4 oz (103.1 kg)  ?Height: 5\' 2"  (1.575 m)  ?  ?General: Well developed, well nourished, in no acute distress  ?Skin : Warm and dry.  ?Head: Normocephalic and atraumatic  ?Eyes: Sclera and conjunctiva clear; pupils round and reactive to light; extraocular movements intact  ?Ears: External normal; canals clear; tympanic membranes normal  ?Oropharynx: Pink, supple. No suspicious lesions  ?Neck: Supple without thyromegaly, adenopathy  ?Lungs: Respirations unlabored;  ?Neurologic: Alert and oriented; speech intact; face symmetrical; moves all extremities well; CNII-XII intact  without focal deficit  ? ?Assessment:  ?1. Sore throat   ?  ?Plan:  ?Physical exam is reassuring and strep test is negative; ?Rx for Z-pak to fill and hold only if worsening over the weekend;  ?Increase fluids, rest- suspect allergic rhinitis contributing;  ? ?No follow-ups on file.  ?Orders Placed This Encounter  ?Procedures  ? POCT rapid strep A  ?  ?Requested Prescriptions  ? ?Signed Prescriptions Disp Refills  ? azithromycin (ZITHROMAX) 250 MG tablet 6 tablet 0  ?  Sig: 2 tabs po qd x 1 day; 1 tablet per day x 4 days;  ?  ? ?

## 2021-10-30 ENCOUNTER — Other Ambulatory Visit: Payer: Self-pay | Admitting: Family Medicine

## 2021-10-30 DIAGNOSIS — E559 Vitamin D deficiency, unspecified: Secondary | ICD-10-CM

## 2021-11-08 ENCOUNTER — Encounter: Payer: Self-pay | Admitting: Psychiatry

## 2021-11-08 ENCOUNTER — Other Ambulatory Visit: Payer: Self-pay | Admitting: Psychiatry

## 2021-11-08 MED ORDER — ACETAZOLAMIDE ER 500 MG PO CP12: ORAL_CAPSULE | ORAL | 6 refills | Status: DC

## 2021-11-13 NOTE — Telephone Encounter (Signed)
Could we get her eye exam notes from last month as well? Thank you

## 2021-11-14 NOTE — Progress Notes (Unsigned)
   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 MRI brain 07/2021 was unremarkable. LP showed an elevated opening pressure of 27. LP was complicated by post LP headache, which improved with a blood patch. She was started on Diamox 500 mg BID.  Interval History: The patient did well on Diamox 500 mg BID until a few weeks ago. She is back to having headaches daily. Headaches are associated with nausea and photophobia. She will feel brief sharp pains in her forehead. It does not seem to be positional. Caffeine seems to help. Headaches are worse than the ones she had prior to the spinal tap, and do not feel like the ones she had after the spinal tap either.  Vision is more blurred. Her Diamox was increased to 500 mg/1000 mg last week. So far she has not noticed improvement.  Last eye exam was 09/19/2021. Per documentation, optic discs appeared elevated at that exam.  Never got CTV done as she was suffering from post LP headaches when it was originally scheduled.  Headache days per month: 30 Headache free days per month: 0  Current Headache Regimen: Preventative: Diamox 500/1000 Abortive: Excedrin  Prior Therapies                                  Excedrin  Physical Exam:   Vital Signs: BP 121/83   Pulse 80   Ht 5\' 2"  (1.575 m)   Wt 225 lb (102.1 kg)   BMI 41.15 kg/m  GENERAL:  well appearing, in no acute distress, alert  SKIN:  Color, texture, turgor normal. No rashes or lesions HEAD:  Normocephalic/atraumatic. RESP: normal respiratory effort MSK:  No gross joint deformities.   NEUROLOGICAL: Mental Status: Alert, oriented to person, place and time, Follows commands, and Speech fluent and appropriate. Cranial Nerves: papilledema present bilaterally, PERRL, face symmetric, no dysarthria, hearing grossly intact Motor: moves all extremities equally Gait: normal-based.  IMPRESSION: 26  year old female with a history of migraines who presents for follow up of IIH (opening pressure 27). She initially had improvement in her headaches with Diamox 500 mg BID, but is now back to severe daily headaches. Fundus exam with persistent papilledema. Will continue Diamox 500/1000 for now as she just increased it. If no improvement will plan to uptitrate further. Encouraged her to get CTV done to ensure no CVST or significant venous stenosis. Will prescribe Maxalt for rescue as headaches do have a migrainous component. Counseled her to seek ED evaluation if she experiences sudden worsening of headache or vision loss.  PLAN: -CT venogram -Continue Diamox 500/1000, will uptitrate further if no improvement in the next couple of weeks -Start Maxalt 10 mg PRN -She will follow up with ophthalmology for updated retinal scans  Follow-up: 5 months or sooner if needed  I spent a total of 26 minutes on the date of the service. Headache education was done. Discussed treatment options including preventive and acute medications, and natural supplements. Discussed medication overuse headache and to limit use of acute treatments to no more than 2 days/week or 10 days/month. Discussed medication side effects, adverse reactions and drug interactions. Written educational materials and patient instructions outlining all of the above were given.  01-18-2004, MD 11/15/21 9:24 AM

## 2021-11-15 ENCOUNTER — Telehealth: Payer: Self-pay | Admitting: Psychiatry

## 2021-11-15 ENCOUNTER — Ambulatory Visit: Payer: Commercial Managed Care - HMO | Admitting: Psychiatry

## 2021-11-15 ENCOUNTER — Other Ambulatory Visit (HOSPITAL_COMMUNITY)
Admission: RE | Admit: 2021-11-15 | Discharge: 2021-11-15 | Disposition: A | Discharge status: Home / Self Care | Payer: Commercial Managed Care - HMO | Source: Ambulatory Visit | Attending: Family Medicine | Admitting: Family Medicine

## 2021-11-15 ENCOUNTER — Encounter: Payer: Self-pay | Admitting: Family Medicine

## 2021-11-15 ENCOUNTER — Ambulatory Visit (INDEPENDENT_AMBULATORY_CARE_PROVIDER_SITE_OTHER): Payer: Commercial Managed Care - HMO | Admitting: Family Medicine

## 2021-11-15 VITALS — BP 121/83 | HR 80 | Ht 62.0 in | Wt 225.0 lb

## 2021-11-15 VITALS — BP 134/99 | HR 90 | Temp 97.7°F | Ht 62.0 in | Wt 227.6 lb

## 2021-11-15 DIAGNOSIS — Z124 Encounter for screening for malignant neoplasm of cervix: Secondary | ICD-10-CM | POA: Diagnosis not present

## 2021-11-15 DIAGNOSIS — Z3009 Encounter for other general counseling and advice on contraception: Secondary | ICD-10-CM | POA: Diagnosis not present

## 2021-11-15 DIAGNOSIS — R300 Dysuria: Secondary | ICD-10-CM

## 2021-11-15 DIAGNOSIS — R519 Headache, unspecified: Secondary | ICD-10-CM

## 2021-11-15 DIAGNOSIS — N898 Other specified noninflammatory disorders of vagina: Secondary | ICD-10-CM | POA: Insufficient documentation

## 2021-11-15 DIAGNOSIS — G932 Benign intracranial hypertension: Secondary | ICD-10-CM

## 2021-11-15 LAB — POCT URINALYSIS DIP (MANUAL ENTRY)
Bilirubin, UA: NEGATIVE
Blood, UA: NEGATIVE
Glucose, UA: NEGATIVE mg/dL
Ketones, POC UA: NEGATIVE mg/dL
Leukocytes, UA: NEGATIVE
Nitrite, UA: NEGATIVE
Protein Ur, POC: NEGATIVE mg/dL
Spec Grav, UA: 1.01 (ref 1.010–1.025)
Urobilinogen, UA: 0.2 E.U./dL
pH, UA: 7.5 (ref 5.0–8.0)

## 2021-11-15 LAB — POCT URINE PREGNANCY: Preg Test, Ur: NEGATIVE

## 2021-11-15 MED ORDER — FLUCONAZOLE 150 MG PO TABS: 150.0000 mg | ORAL_TABLET | Freq: Once | ORAL | 0 refills | Status: AC

## 2021-11-15 MED ORDER — RIZATRIPTAN BENZOATE 10 MG PO TABS: 10.0000 mg | ORAL_TABLET | ORAL | 6 refills | Status: DC | PRN

## 2021-11-15 NOTE — Patient Instructions (Signed)
Good to see you today- I will be in touch with your pap, vaginal swab and urine culture asap For the time being let's try a diflucan pill for yeast infection- take one, repeat in a week if needed  Let me touch base with your neurologist about contraception options for you.  However, you are likely to become pregnant if you do not use a contraceptive of some sort- if this is not the goal right now please use condoms!

## 2021-11-15 NOTE — Telephone Encounter (Signed)
[employer] sent to GI they obtain auth and will call the patient to schedule 

## 2021-11-15 NOTE — Patient Instructions (Addendum)
Start rizatriptan as needed for migraine. Take at the onset of migraine. If headache recurs or does not fully resolve, you may take a second dose after 2 hours. Please avoid taking more than 2 days per week to avoid rebound headaches  CT of veins in the brain  Please get updated retinal scans  Natural supplements that can reduce migraines: Magnesium Oxide or Magnesium Glycinate 500 mg at bed (up to 800 mg daily) Coenzyme Q10 300 mg in AM Vitamin B2- 200 mg twice a day  Add 1 supplement at a time since even natural supplements can have undesirable side effects. You can sometimes buy supplements cheaper (especially Coenzyme Q10) at www.[employer]mailGuide.co.za or at [employer].  Magnesium: Magnesium (250 mg twice a day or 500 mg at bed) has a relaxant effect on smooth muscles such as blood vessels. Individuals suffering from frequent or daily headache usually have low magnesium levels which can be increase with daily supplementation of 400-800 mg. Three trials found 40-90% average headache reduction  when used as a preventative. Magnesium also demonstrated the benefit in menstrually related migraine.  Magnesium is part of the messenger system in the serotonin cascade and it is a good muscle relaxant.  It is also useful for constipation. Good sources include nuts, whole grains, and tomatoes. Side Effects: loose stool/diarrhea  Riboflavin (vitamin B 2) 200 mg twice a day. This vitamin assists nerve cells in the production of ATP a principal energy storing molecule.  It is necessary for many chemical reactions in the body.  There have been at least 3 clinical trials of riboflavin using 400 mg per day all of which suggested that migraine frequency can be decreased.  All 3 trials showed significant improvement in over half of migraine sufferers.  The supplement is found in bread, cereal, milk, meat, and poultry.  Most Americans get more riboflavin than the recommended daily allowance, however riboflavin deficiency is not  necessary for the supplements to help prevent headache. Side effects: energizing, green urine  Coenzyme Q10: This is present in almost all cells in the body and is critical component for the conversion of energy.  Recent studies have shown that a nutritional supplement of CoQ10 can reduce the frequency of migraine attacks by improving the energy production of cells as with riboflavin.  Doses of 150 mg twice a day have been shown to be effective.

## 2021-11-15 NOTE — Progress Notes (Signed)
[facility] at [facility] [address] [place], [place] [address] [phone]  Date:  11/15/2021   Name:  Jennifer Yoder   DOB:  Aug 09, 1995   MRN:  [id number]  PCP:  Pearline Cables, MD    Chief Complaint: No chief complaint on file.   History of Present Illness:  Jennifer Yoder is a 26 y.o. very pleasant female patient who presents with the following:  Patient seen today with a possible UTI Her most recent interactions have involved diagnosis of pseudotumor cerebri due to papilledema and headaches She was actually already seen this morning at [facility] neuro for follow-up: IMPRESSION: 26 year old female with a history of migraines who presents for follow up of IIH (opening pressure 27). She initially had improvement in her headaches with Diamox 500 mg BID, but is now back to severe daily headaches. Fundus exam with persistent papilledema. Will continue Diamox 500/1000 for now as she just increased it. If no improvement will plan to uptitrate further. Encouraged her to get CTV done to ensure no CVST or significant venous stenosis. Will prescribe Maxalt for rescue as headaches do have a migrainous component. Counseled her to seek ED evaluation if she experiences sudden worsening of headache or vision loss. PLAN: -CT venogram -Continue Diamox 500/1000, will uptitrate further if no improvement in the next couple of weeks -Start Maxalt 10 mg PRN -She will follow up with ophthalmology for updated retinal scans   Today pt notes about a month ago she felt like she might have a UTI- slight dysuria and discomfort with wiping, she tried some azo and felt better However sx seemed to return about a week ago She notes urinary frequency but this is not totally unusual for her No dysuira or hematuria No vomiting or fever  She is not having vulvar itching but her vagina seems inflamed  She had sex and it was uncomfortable   Most recent pap a few  years ago but no history of abnormal Pap smear LMP was the first of May -she expects any day For contraception she is not using anything right now- she does not wish to be pregnant at this time My last intercourse about 1 week ago, so emergency contraception is not indicated There are no problems to display for this patient.   No past medical history on file.  No past surgical history on file.  Social History   Tobacco Use   Smoking status: Never   Smokeless tobacco: Never  Substance Use Topics   Alcohol use: Not Currently   Drug use: Never    No family history on file.  No Known Allergies  Medication list has been reviewed and updated.  Current Outpatient Medications on File Prior to Visit  Medication Sig Dispense Refill   acetaZOLAMIDE ER (DIAMOX) 500 MG capsule Take 500 mg (1 pill) in the morning and 1000 mg (2 pill) at bedtime 90 capsule 6   hydrOXYzine (ATARAX) 10 MG tablet Take 1-2 tablets (10-20 mg total) by mouth 3 (three) times daily as needed for anxiety. 40 tablet 0   rizatriptan (MAXALT) 10 MG tablet Take 1 tablet (10 mg total) by mouth as needed for migraine. May repeat in 2 hours if needed 10 tablet 6   No current facility-administered medications on file prior to visit.    Review of Systems:  As per HPI- otherwise negative.   Physical Examination: Vitals:   11/15/21 1046  BP: Marland Kitchen)  134/99  Pulse: 90  Temp: 97.7 F (36.5 C)   Vitals:   11/15/21 1046  Weight: 227 lb 9.6 oz (103.2 kg)  Height: 5\' 2"  (1.575 m)   Body mass index is 41.63 kg/m. Ideal Body Weight: Weight in (lb) to have BMI = 25: 136.4  GEN: no acute distress.  Obese, otherwise looks well HEENT: Atraumatic, Normocephalic.  Ears and Nose: No external deformity. CV: RRR, No M/G/R. No JVD. No thrill. No extra heart sounds. PULM: CTA B, no wheezes, crackles, rhonchi. No retractions. No resp. distress. No accessory muscle use. ABD: S, NT, ND, +BS. No rebound. No HSM. EXTR: No  c/c/e PSYCH: Normally interactive. Conversant.  Pelvic exam today is normal, no lesions or particular discharge.  No cervical motion tenderness.  Perhaps mild inflammation of the vaginal canal  Results for orders placed or performed in visit on 11/15/21  POCT urinalysis dipstick  Result Value Ref Range   Color, UA yellow yellow   Clarity, UA cloudy (A) clear   Glucose, UA negative negative mg/dL   Bilirubin, UA negative negative   Ketones, POC UA negative negative mg/dL   Spec Grav, UA 01/15/22 1.856 - 1.025   Blood, UA negative negative   pH, UA 7.5 5.0 - 8.0   Protein Ur, POC negative negative mg/dL   Urobilinogen, UA 0.2 0.2 or 1.0 E.U./dL   Nitrite, UA Negative Negative   Leukocytes, UA Negative Negative  POCT urine pregnancy  Result Value Ref Range   Preg Test, Ur Negative Negative    Assessment and Plan: Dysuria - Plan: Urine Culture, POCT urinalysis dipstick, POCT urine pregnancy  Vaginal itching - Plan: Cervicovaginal ancillary only( [place]), fluconazole (DIFLUCAN) 150 MG tablet  Screening for cervical cancer - Plan: Cytology - PAP  Encounter for other general counseling or advice on contraception Patient seen today with concern of vulvar discomfort.  Urinalysis appears benign, cultures pending For the time being we will give her a Diflucan pill for possible yeast infection, await further testing as above Also did a Pap today Patient is not currently preventing pregnancy, she does not wish to be pregnant in the next year or so.  Advised her since we are working on getting her intracranial hypertension under control avoiding pregnancy for now is likely wise.  She would be interested in using a progesterone only birth control pill assuming neurology approves-I have reached out to Dr.Chima and will be in touch with patient  Signed 3.149, MD  I did hear back from her neurologist, Dr. Abbe Amsterdam who approves of a progesterone only pill.  Message to patient

## 2021-11-16 LAB — CERVICOVAGINAL ANCILLARY ONLY
Bacterial Vaginitis (gardnerella): POSITIVE — AB
Candida Glabrata: NEGATIVE
Candida Vaginitis: POSITIVE — AB
Chlamydia: NEGATIVE
Comment: NEGATIVE
Comment: NEGATIVE
Comment: NEGATIVE
Comment: NEGATIVE
Comment: NEGATIVE
Comment: NORMAL
Neisseria Gonorrhea: NEGATIVE
Trichomonas: NEGATIVE

## 2021-11-16 LAB — URINE CULTURE
MICRO NUMBER:: 13470373
SPECIMEN QUALITY:: ADEQUATE

## 2021-11-16 LAB — CYTOLOGY - PAP
Comment: NEGATIVE
Diagnosis: NEGATIVE
High risk HPV: NEGATIVE

## 2021-11-17 ENCOUNTER — Encounter: Payer: Self-pay | Admitting: Family Medicine

## 2021-11-17 DIAGNOSIS — B9689 Other specified bacterial agents as the cause of diseases classified elsewhere: Secondary | ICD-10-CM

## 2021-11-26 ENCOUNTER — Encounter: Payer: Self-pay | Admitting: Psychiatry

## 2021-11-27 MED ORDER — METRONIDAZOLE 500 MG PO TABS: 500.0000 mg | ORAL_TABLET | Freq: Two times a day (BID) | ORAL | 0 refills | Status: DC

## 2021-12-04 ENCOUNTER — Telehealth: Payer: Self-pay | Admitting: *Deleted

## 2021-12-04 ENCOUNTER — Other Ambulatory Visit: Payer: Self-pay | Admitting: Psychiatry

## 2021-12-04 MED ORDER — TOPIRAMATE 50 MG PO TABS: ORAL_TABLET | ORAL | 6 refills | Status: DC

## 2021-12-04 NOTE — Telephone Encounter (Signed)
Received fax from [facility] specialists, placed on MD desk for review.

## 2021-12-12 ENCOUNTER — Encounter: Payer: Self-pay | Admitting: Family Medicine

## 2021-12-12 DIAGNOSIS — G932 Benign intracranial hypertension: Secondary | ICD-10-CM

## 2021-12-13 ENCOUNTER — Ambulatory Visit: Payer: Self-pay | Admitting: Psychiatry

## 2021-12-13 DIAGNOSIS — G932 Benign intracranial hypertension: Secondary | ICD-10-CM | POA: Insufficient documentation

## 2021-12-13 NOTE — Addendum Note (Signed)
Addended by: Abbe Amsterdam C on: 12/13/2021 12:44 PM   Modules accepted: Orders

## 2021-12-14 ENCOUNTER — Encounter: Payer: Self-pay | Admitting: Psychiatry

## 2021-12-19 NOTE — Telephone Encounter (Signed)
I sent her a message. She's welcome to seek a second opinion with another neurologist if she prefers

## 2021-12-20 ENCOUNTER — Ambulatory Visit: Payer: Managed Care, Other (non HMO) | Admitting: Psychiatry

## 2022-01-20 NOTE — Progress Notes (Unsigned)
[facility] at [facility] [address] [place], [place] [address] [phone]  Date:  01/24/2022   Name:  MATALIE ROMBERGER   DOB:  Sep 12, 1995   MRN:  [id number]  PCP:  Pearline Cables, MD    Chief Complaint: No chief complaint on file.   History of Present Illness:  YONEKO TALERICO is a 26 y.o. very pleasant female patient who presents with the following:  Pt seen today with abdominal cramping Last seen by myself in June - recent dx of pseudotumor cerebri and headaches, overweight, otherwise in good health She had wanted to change her neurology care to [facility]. I placed a referral in June but don't see where she was seen as of yet   Tetanus booster may be due Gardasil?  Patient Active Problem List   Diagnosis Date Noted   IIH (idiopathic intracranial hypertension) 12/13/2021    No past medical history on file.  No past surgical history on file.  Social History   Tobacco Use   Smoking status: Never   Smokeless tobacco: Never  Substance Use Topics   Alcohol use: Not Currently   Drug use: Never    No family history on file.  No Known Allergies  Medication list has been reviewed and updated.  Current Outpatient Medications on File Prior to Visit  Medication Sig Dispense Refill   hydrOXYzine (ATARAX) 10 MG tablet Take 1-2 tablets (10-20 mg total) by mouth 3 (three) times daily as needed for anxiety. 40 tablet 0   metroNIDAZOLE (FLAGYL) 500 MG tablet Take 1 tablet (500 mg total) by mouth 2 (two) times daily. Take 1 pill twice daily for one week. NO alcohol 14 tablet 0   rizatriptan (MAXALT) 10 MG tablet Take 1 tablet (10 mg total) by mouth as needed for migraine. May repeat in 2 hours if needed 10 tablet 6   topiramate (TOPAMAX) 50 MG tablet Take 50 mg (1 pill) at bedtime for one week, then increase to 100 mg (2 pills) at bedtime 60 tablet 6   No current facility-administered medications on file prior to visit.     Review of Systems:  As per HPI- otherwise negative.   Physical Examination: There were no vitals filed for this visit. There were no vitals filed for this visit. There is no height or weight on file to calculate BMI. Ideal Body Weight:    GEN: no acute distress. HEENT: Atraumatic, Normocephalic.  Ears and Nose: No external deformity. CV: RRR, No M/G/R. No JVD. No thrill. No extra heart sounds. PULM: CTA B, no wheezes, crackles, rhonchi. No retractions. No resp. distress. No accessory muscle use. ABD: S, NT, ND, +BS. No rebound. No HSM. EXTR: No c/c/e PSYCH: Normally interactive. Conversant.    Assessment and Plan: ***  Signed Abbe Amsterdam, MD

## 2022-01-24 ENCOUNTER — Encounter: Payer: Self-pay | Admitting: Family Medicine

## 2022-01-24 ENCOUNTER — Ambulatory Visit (HOSPITAL_BASED_OUTPATIENT_CLINIC_OR_DEPARTMENT_OTHER)
Admission: RE | Admit: 2022-01-24 | Discharge: 2022-01-24 | Disposition: A | Discharge status: Home / Self Care | Payer: Commercial Managed Care - HMO | Source: Ambulatory Visit | Attending: Family Medicine | Admitting: Family Medicine

## 2022-01-24 ENCOUNTER — Ambulatory Visit (INDEPENDENT_AMBULATORY_CARE_PROVIDER_SITE_OTHER): Payer: Commercial Managed Care - HMO | Admitting: Family Medicine

## 2022-01-24 VITALS — BP 112/72 | HR 85 | Temp 97.9°F | Resp 18 | Ht 62.0 in | Wt 225.2 lb

## 2022-01-24 DIAGNOSIS — Z30012 Encounter for prescription of emergency contraception: Secondary | ICD-10-CM

## 2022-01-24 DIAGNOSIS — R1012 Left upper quadrant pain: Secondary | ICD-10-CM | POA: Insufficient documentation

## 2022-01-24 DIAGNOSIS — F419 Anxiety disorder, unspecified: Secondary | ICD-10-CM

## 2022-01-24 LAB — COMPREHENSIVE METABOLIC PANEL
ALT: 16 U/L (ref 0–35)
AST: 17 U/L (ref 0–37)
Albumin: 4.5 g/dL (ref 3.5–5.2)
Alkaline Phosphatase: 56 U/L (ref 39–117)
BUN: 10 mg/dL (ref 6–23)
CO2: 24 mEq/L (ref 19–32)
Calcium: 9.4 mg/dL (ref 8.4–10.5)
Chloride: 108 mEq/L (ref 96–112)
Creatinine, Ser: 0.66 mg/dL (ref 0.40–1.20)
GFR: 121.3 mL/min (ref >60.00)
Glucose, Bld: 82 mg/dL (ref 70–99)
Potassium: 3.9 mEq/L (ref 3.5–5.1)
Sodium: 141 mEq/L (ref 135–145)
Total Bilirubin: 0.4 mg/dL (ref 0.2–1.2)
Total Protein: 7.1 g/dL (ref 6.0–8.3)

## 2022-01-24 LAB — CBC
HCT: 40.7 % (ref 36.0–46.0)
Hemoglobin: 13.3 g/dL (ref 12.0–15.0)
MCHC: 32.7 g/dL (ref 30.0–36.0)
MCV: 90 fl (ref 78.0–100.0)
Platelets: 323 10*3/uL (ref 150.0–400.0)
RBC: 4.53 Mil/uL (ref 3.87–5.11)
RDW: 13.9 % (ref 11.5–15.5)
WBC: 9.6 10*3/uL (ref 4.0–10.5)

## 2022-01-24 LAB — POCT URINE PREGNANCY: Preg Test, Ur: NEGATIVE

## 2022-01-24 LAB — LIPASE: Lipase: 12 U/L (ref 11.0–59.0)

## 2022-01-24 MED ORDER — ULIPRISTAL ACETATE 30 MG PO TABS: 1.0000 | ORAL_TABLET | Freq: Once | ORAL | 99 refills | Status: AC

## 2022-01-24 MED ORDER — HYDROXYZINE HCL 10 MG PO TABS: 10.0000 mg | ORAL_TABLET | Freq: Three times a day (TID) | ORAL | 0 refills | Status: DC | PRN

## 2022-01-24 NOTE — Patient Instructions (Signed)
It was good to see you today-  I will be in touch with your labs and x-rays asap Please let me know if anything is changing or getting worse in the meantime We will plan the next steps with your results from today- we might consider getting an [place] of your pelvis and gallbladder. It might also help to change back to Diamox

## 2022-01-31 ENCOUNTER — Ambulatory Visit (INDEPENDENT_AMBULATORY_CARE_PROVIDER_SITE_OTHER): Payer: Commercial Managed Care - HMO

## 2022-01-31 DIAGNOSIS — R1032 Left lower quadrant pain: Secondary | ICD-10-CM | POA: Diagnosis not present

## 2022-01-31 DIAGNOSIS — R1012 Left upper quadrant pain: Secondary | ICD-10-CM

## 2022-01-31 MED ORDER — IOHEXOL 300 MG/ML  SOLN
100.0000 mL | Freq: Once | INTRAMUSCULAR | Status: AC | PRN
  Administered 2022-01-31: 100 mL via INTRAVENOUS

## 2022-02-01 ENCOUNTER — Encounter: Payer: Self-pay | Admitting: Family Medicine

## 2022-02-01 DIAGNOSIS — R1012 Left upper quadrant pain: Secondary | ICD-10-CM

## 2022-02-02 NOTE — Telephone Encounter (Signed)
Called patient to go over her scan in more detail Advised that with a 2 mm kidney stone and no hydronephrosis I am less suspicious the kidney stone is causing her pain.  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  She did NOT do any back bending sports such as gymnastics as a child.  However, she notes lower back pain for many years, so much she just thought it was normal  Patient notes she continues to have pain mostly in the left rib cage area.  She feels like there is "a stitch in my side" that she wants to stretch out This may be a musculoskeletal issue.  We decided to have her see sports medicine/Dr. Katrinka Blazing to discuss with both her pars defects and possible musculoskeletal pain.  Patient states agreement, she will keep me updated about any changes or worsening of her condition  I also discussed getting a pelvic ultrasound to evaluate for any ovarian cyst.  For the time being she declines, we will keep this in mind

## 2022-02-05 ENCOUNTER — Other Ambulatory Visit (HOSPITAL_BASED_OUTPATIENT_CLINIC_OR_DEPARTMENT_OTHER): Payer: Commercial Managed Care - HMO

## 2022-02-05 ENCOUNTER — Other Ambulatory Visit: Payer: Commercial Managed Care - HMO

## 2022-02-05 ENCOUNTER — Telehealth: Payer: Self-pay

## 2022-02-05 NOTE — Telephone Encounter (Signed)
FYI:   Approval number: [id number] Dates approved: 01/30/22 - 07/29/22

## 2022-02-11 ENCOUNTER — Encounter: Payer: Self-pay | Admitting: Family Medicine

## 2022-02-11 DIAGNOSIS — R102 Pelvic and perineal pain: Secondary | ICD-10-CM

## 2022-02-12 ENCOUNTER — Encounter: Payer: Self-pay | Admitting: Family Medicine

## 2022-02-19 ENCOUNTER — Telehealth (HOSPITAL_BASED_OUTPATIENT_CLINIC_OR_DEPARTMENT_OTHER): Payer: Self-pay

## 2022-03-20 NOTE — Progress Notes (Unsigned)
[place] Phone: [phone] Subjective:   Jennifer Yoder, am serving as a scribe for Dr. Hulan Saas.  I'm seeing this patient by the request  of:  Copland, Gay Filler, MD  CC: left abdominal and back pain   [id number]  Jennifer Yoder is a 26 y.o. female coming in with complaint of LUQ pain. Patient states has been in constant pain for months in abdominal area. Pain is bilateral and a little above the bra line. Sharp pain sometimes. Usually more apparent towards end of day. Feels like tightness and bloating. Says she takes Topamax for IIH and that has been her only change recently.   Patient recently did have a CT abdomen and pelvis done with contrast in August.  Patient was independently visualized by me.  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  Very small left-sided kidney stones noted not impacted.  Yoder sign of any infectious etiology.    Yoder past medical history on file. Yoder past surgical history on file. Social History   Socioeconomic History   Marital status: Single    Spouse name: Not on file   Number of children: Not on file   Years of education: Not on file   Highest education level: Not on file  Occupational History   Not on file  Tobacco Use   Smoking status: Never   Smokeless tobacco: Never  Substance and Sexual Activity   Alcohol use: Not Currently   Drug use: Never   Sexual activity: Not on file  Other Topics Concern   Not on file  Social History Narrative   Not on file   Social Determinants of Health   Financial Resource Strain: Not on file  Food Insecurity: Not on file  Transportation Needs: Not on file  Physical Activity: Not on file  Stress: Not on file  Social Connections: Not on file   Yoder Known Allergies Yoder family history on file.     Current Outpatient Medications (Analgesics):    rizatriptan (MAXALT)  10 MG tablet, Take 1 tablet (10 mg total) by mouth as needed for migraine. May repeat in 2 hours if needed   Current Outpatient Medications (Other):    Vitamin D, Ergocalciferol, (DRISDOL) 1.25 MG (50000 UNIT) CAPS capsule, Take 1 capsule (50,000 Units total) by mouth every 7 (seven) days.   hydrOXYzine (ATARAX) 10 MG tablet, Take 1-2 tablets (10-20 mg total) by mouth 3 (three) times daily as needed for anxiety.   topiramate (TOPAMAX) 50 MG tablet, Take 50 mg (1 pill) at bedtime for one week, then increase to 100 mg (2 pills) at bedtime   Reviewed prior external information including notes and imaging from  primary care provider As well as notes that were available from care everywhere and other healthcare systems.  Past medical history, social, surgical and family history all reviewed in electronic medical record.  Yoder pertanent information unless stated regarding to the chief complaint.   Review of Systems:  Yoder headache, visual changes, nausea, vomiting, diarrhea, constipation, dizziness, skin rash, fevers, chills, night sweats, weight loss, swollen lymph nodes, joint swelling, chest pain, shortness of breath, mood changes. POSITIVE muscle aches, abdominal pain, headache  Objective  Blood pressure 126/74, pulse (!) 110, height 5\' 2"  (1.575 m), weight 223 lb (101.2 kg), SpO2 96 %.   General: Yoder apparent distress alert and oriented x3 mood and affect  normal, dressed appropriately.  HEENT: Pupils equal, extraocular movements intact  Respiratory: Patient's speak in full sentences and does not appear short of breath  Cardiovascular: Yoder lower extremity edema, non tender, Yoder erythema  Patient does have tightness noted in the thoracolumbar lumbar area. Patient does have some tightness noted in the parascapular area right greater than left.  Lower back also has some tenderness to palpation.  Osteopathic findings  C4 flexed rotated and side bent left C6 flexed rotated and side bent left T3  extended rotated and side bent right inhaled third rib T8 extended rotated and side bent right inhaled rib        Impression and Recommendations:    The above documentation has been reviewed and is accurate and complete Lyndal Pulley, DO

## 2022-03-21 ENCOUNTER — Ambulatory Visit: Payer: Commercial Managed Care - HMO | Admitting: Family Medicine

## 2022-03-21 VITALS — BP 126/74 | HR 110 | Ht 62.0 in | Wt 223.0 lb

## 2022-03-21 DIAGNOSIS — M9903 Segmental and somatic dysfunction of lumbar region: Secondary | ICD-10-CM

## 2022-03-21 DIAGNOSIS — M940 Chondrocostal junction syndrome [Tietze]: Secondary | ICD-10-CM | POA: Diagnosis not present

## 2022-03-21 DIAGNOSIS — M9901 Segmental and somatic dysfunction of cervical region: Secondary | ICD-10-CM

## 2022-03-21 DIAGNOSIS — M9908 Segmental and somatic dysfunction of rib cage: Secondary | ICD-10-CM | POA: Diagnosis not present

## 2022-03-21 DIAGNOSIS — M9902 Segmental and somatic dysfunction of thoracic region: Secondary | ICD-10-CM

## 2022-03-21 DIAGNOSIS — E559 Vitamin D deficiency, unspecified: Secondary | ICD-10-CM | POA: Insufficient documentation

## 2022-03-21 MED ORDER — VITAMIN D (ERGOCALCIFEROL) 1.25 MG (50000 UNIT) PO CAPS: 50000.0000 [IU] | ORAL_CAPSULE | ORAL | 0 refills | Status: DC

## 2022-03-21 NOTE — Assessment & Plan Note (Signed)

## 2022-03-21 NOTE — Assessment & Plan Note (Signed)
Once weekly vitamin D for 12 weeks

## 2022-03-21 NOTE — Assessment & Plan Note (Signed)
Slipped rib syndrome showed HEP  Discussed which activities to do and which ones to avoid. RTC in 6 weeks

## 2022-03-21 NOTE — Patient Instructions (Addendum)
Do prescribed exercises at least 3x a week Weekly Vit D prescribed

## 2022-04-24 NOTE — Progress Notes (Unsigned)
   CC:  headaches  Follow-up Visit  Last visit: 11/15/21  Brief HPI: 26 year old female with a history of migraines who follows in clinic for IIH. MRI brain 07/2021 was unremarkable. LP showed an elevated opening pressure of 27 and she was started on Diamox.    At her last visit she was started on rizatriptan for migraine rescue. Interval History: Ophthalmology exam in June 2023 showed resolved papilledema. Her Diamox was switched to Topamax for migraine prevention.*** Maxalt***   Headache days per month: *** Migraine days per month*** Headache free days per month: ***  Current Headache Regimen: Preventative: *** Abortive: ***   Prior Therapies                                  ***  Physical Exam:   Vital Signs: There were no vitals taken for this visit. GENERAL:  well appearing, in no acute distress, alert  SKIN:  Color, texture, turgor normal. No rashes or lesions HEAD:  Normocephalic/atraumatic. RESP: normal respiratory effort MSK:  No gross joint deformities.   NEUROLOGICAL: Mental Status: Alert, oriented to person, place and time, Follows commands, and Speech fluent and appropriate. Cranial Nerves: PERRL, face symmetric, no dysarthria, hearing grossly intact Motor: moves all extremities equally Gait: normal-based.  IMPRESSION: ***  PLAN: ***   Follow-up: ***  I spent a total of *** minutes on the date of the service. Headache education was done. Discussed lifestyle modification including increased oral hydration, decreased caffeine, exercise and stress management. Discussed treatment options including preventive and acute medications, natural supplements, and infusion therapy. Discussed medication overuse headache and to limit use of acute treatments to no more than 2 days/week or 10 days/month. Discussed medication side effects, adverse reactions and drug interactions. Written educational materials and patient instructions outlining all of the above were  given.  Ocie Doyne, MD

## 2022-04-25 ENCOUNTER — Ambulatory Visit: Payer: Commercial Managed Care - HMO | Admitting: Psychiatry

## 2022-04-25 VITALS — BP 139/94 | HR 85 | Ht 62.0 in | Wt 223.0 lb

## 2022-04-25 DIAGNOSIS — G932 Benign intracranial hypertension: Secondary | ICD-10-CM | POA: Diagnosis not present

## 2022-04-25 DIAGNOSIS — G43009 Migraine without aura, not intractable, without status migrainosus: Secondary | ICD-10-CM | POA: Diagnosis not present

## 2022-04-25 MED ORDER — TOPIRAMATE 50 MG PO TABS: ORAL_TABLET | ORAL | 6 refills | Status: DC

## 2022-04-25 NOTE — Patient Instructions (Addendum)
Increase Topamax to 50 mg in the morning and 100 mg at bedtime. You can take over the counter potassium 99 mg daily to help with tingling in your fingertips/toes  Please set up a follow up appointment with ophthalmology to check for swelling in the back of your eyes

## 2022-06-25 NOTE — Progress Notes (Unsigned)
[facility] [address], [place], [place] [address] [phone]  Date:  06/27/2022   Name:  Jennifer Yoder   DOB:  10-Jan-1996   MRN:  [id number]  PCP:  Darreld Mclean, MD    Chief Complaint: No chief complaint on file.   History of Present Illness:  Jennifer Yoder is a 27 y.o. 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  No past surgical history on file.  Social History   Tobacco Use   Smoking status: Never   Smokeless tobacco: Never  Substance Use Topics   Alcohol use: Not Currently   Drug use: Never    No family history on file.  No Known Allergies  Medication list has been reviewed and updated.  Current Outpatient Medications on File Prior to Visit  Medication Sig Dispense Refill   hydrOXYzine (ATARAX) 10 MG tablet Take 1-2 tablets (10-20 mg total) by mouth 3 (three) times daily as needed for anxiety. 40 tablet 0   rizatriptan (MAXALT) 10 MG tablet Take 1 tablet (10 mg total) by mouth as needed for migraine. May repeat in 2 hours if needed 10 tablet 6   topiramate (TOPAMAX) 50 MG tablet Take 50 mg (1 pill) in the morning and 100 mg (2 pills) at bedtime 90 tablet 6   Vitamin D, Ergocalciferol, (DRISDOL) 1.25 MG (50000 UNIT) CAPS capsule Take 1 capsule (50,000 Units total) by mouth every 7 (seven) days. 12 capsule 0   No current facility-administered medications on file prior to visit.    Review of Systems:  As per HPI- otherwise negative.   Physical Examination: There were no vitals  filed for this visit. There were no vitals filed for this visit. There is no height or weight on file to calculate BMI. Ideal Body Weight:    GEN: no acute distress. HEENT: Atraumatic, Normocephalic.  Ears and Nose: No external deformity. CV: RRR, No M/G/R. No JVD. No thrill. No extra heart sounds. PULM: CTA B, no wheezes, crackles, rhonchi. No retractions. No resp. distress. No accessory muscle use. ABD: S, NT, ND, +BS. No rebound. No HSM. EXTR: No c/c/e PSYCH: Normally interactive. Conversant.    Assessment and Plan: ***  Signed Lamar Blinks, MD

## 2022-06-27 ENCOUNTER — Ambulatory Visit (INDEPENDENT_AMBULATORY_CARE_PROVIDER_SITE_OTHER): Payer: Commercial Managed Care - HMO | Admitting: Family Medicine

## 2022-06-27 VITALS — BP 120/74 | HR 86 | Temp 97.6°F | Resp 18 | Ht 62.0 in | Wt 221.0 lb

## 2022-06-27 DIAGNOSIS — N63 Unspecified lump in unspecified breast: Secondary | ICD-10-CM

## 2022-06-27 NOTE — Patient Instructions (Addendum)
It was good to see again today.  I suspect which you have noticed in your breast is normal tissue, but certainly we will get an ultrasound to make sure all is well! I went ahead and ordered an ultrasound of both breasts to be done at the breast center [facility] will call you to set this up, but you can also give them a call if you prefer!   Address: [address], [place], [place] [address] Phone: [phone]

## 2022-07-22 ENCOUNTER — Other Ambulatory Visit: Payer: Self-pay | Admitting: Family Medicine

## 2022-07-22 ENCOUNTER — Telehealth: Payer: Self-pay | Admitting: Family Medicine

## 2022-07-22 DIAGNOSIS — N63 Unspecified lump in unspecified breast: Secondary | ICD-10-CM

## 2022-07-22 NOTE — Telephone Encounter (Signed)
Patient called wanting to make Dr. Lorelei Pont aware that the Breast center that is supposed to do the imaging for both her breast have been giving her a lot of trouble. The latest issue is that they now have rescheduled her for March 21st due to an error on their part. Patient states she does not think she should wait and wants to get Dr. Lillie Fragmin opinion. Please advise.

## 2022-07-25 ENCOUNTER — Other Ambulatory Visit: Payer: Commercial Managed Care - HMO

## 2022-07-25 ENCOUNTER — Ambulatory Visit
Admission: RE | Admit: 2022-07-25 | Discharge: 2022-07-25 | Disposition: A | Discharge status: Home / Self Care | Payer: Commercial Managed Care - HMO | Source: Ambulatory Visit | Attending: Family Medicine | Admitting: Family Medicine

## 2022-07-25 ENCOUNTER — Encounter: Payer: Self-pay | Admitting: Family Medicine

## 2022-07-25 DIAGNOSIS — N63 Unspecified lump in unspecified breast: Secondary | ICD-10-CM

## 2022-08-06 ENCOUNTER — Encounter: Payer: Self-pay | Admitting: Family

## 2022-08-06 ENCOUNTER — Ambulatory Visit (INDEPENDENT_AMBULATORY_CARE_PROVIDER_SITE_OTHER): Payer: Commercial Managed Care - HMO | Admitting: Family

## 2022-08-06 VITALS — BP 114/64 | HR 95 | Temp 98.2°F | Resp 18 | Ht 62.0 in | Wt 218.8 lb

## 2022-08-06 DIAGNOSIS — J019 Acute sinusitis, unspecified: Secondary | ICD-10-CM

## 2022-08-06 MED ORDER — AMOXICILLIN-POT CLAVULANATE 875-125 MG PO TABS: 1.0000 | ORAL_TABLET | Freq: Two times a day (BID) | ORAL | 0 refills | Status: AC

## 2022-08-06 NOTE — Progress Notes (Signed)
  Jennifer Yoder is a 27 y.o. 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 20 tablet 0   hydrOXYzine (ATARAX) 10 MG tablet Take 1-2 tablets (10-20 mg total) by mouth 3 (three) times daily as needed for anxiety. 40 tablet 0   rizatriptan (MAXALT) 10 MG tablet Take 1 tablet (10 mg total) by mouth as needed for migraine. May repeat in 2 hours if needed 10 tablet 6   topiramate (TOPAMAX) 50 MG tablet Take 50 mg (1 pill) in the morning and 100 mg (2 pills) at bedtime 90 tablet 6   Vitamin D, Ergocalciferol, (DRISDOL) 1.25 MG (50000 UNIT) CAPS capsule Take 1 capsule (50,000 Units total) by mouth every 7 (seven) days. 12 capsule 0   No current facility-administered medications for this visit.    Allergies: Patient has no known allergies.  No past medical history on file.  No past surgical history on file.  No family history on file.  Social History   Tobacco Use   Smoking status: Never   Smokeless tobacco: Never  Substance Use Topics   Alcohol use: Not Currently    Subjective:   1 week history of sinus congestion/ cough; +facial pressure; no fever;   Objective:  Vitals:   08/06/22 1310  BP: 114/64  Pulse: 95  Resp: 18  Temp: 98.2 F (36.8 C)  TempSrc: Oral  SpO2: 98%  Weight: 218 lb 12.8 oz (99.2 kg)  Height: 5' 2"$  (1.575 m)    General: Well developed, well nourished, in no acute distress  Skin : Warm and dry.  Head: Normocephalic and atraumatic  Eyes: Sclera and conjunctiva clear; pupils round and reactive to light; extraocular movements intact  Ears: External normal;  canals clear; tympanic membranes normal  Oropharynx: Pink, supple. No suspicious lesions  Neck: Supple without thyromegaly, adenopathy  Lungs: Respirations unlabored; clear to auscultation bilaterally without wheeze, rales, rhonchi  CVS exam: normal rate and regular rhythm.  Neurologic: Alert and oriented; speech intact; face symmetrical; moves all extremities well; CNII-XII intact without focal deficit   Assessment:  1. Acute sinusitis, recurrence not specified, unspecified location     Plan:  Rx for Augmentin 875 mg bid x 10 days; increase fluids, rest and follow up worse, no better;   No follow-ups on file.  No orders of the defined types were placed in this encounter.   Requested Prescriptions   Signed Prescriptions Disp Refills   amoxicillin-clavulanate (AUGMENTIN) 875-125 MG tablet 20 tablet 0    Sig: Take 1 tablet by mouth 2 (two) times daily for 10 days.

## 2022-08-16 ENCOUNTER — Other Ambulatory Visit: Payer: Self-pay | Admitting: Family

## 2022-08-16 ENCOUNTER — Encounter: Payer: Self-pay | Admitting: Family

## 2022-08-16 MED ORDER — FLUCONAZOLE 150 MG PO TABS: ORAL_TABLET | ORAL | 0 refills | Status: DC

## 2022-08-20 ENCOUNTER — Other Ambulatory Visit: Payer: Self-pay | Admitting: Psychiatry

## 2022-08-20 NOTE — Telephone Encounter (Signed)
Pt called requesting medication be sent to  pharmacy today because she is completely out of medication.

## 2022-08-20 NOTE — Telephone Encounter (Signed)
Patient scheduled on 09/12/22.

## 2022-09-05 ENCOUNTER — Other Ambulatory Visit: Payer: Commercial Managed Care - HMO

## 2022-09-12 ENCOUNTER — Ambulatory Visit: Payer: Commercial Managed Care - HMO | Admitting: Psychiatry

## 2022-09-12 VITALS — BP 138/90 | HR 85 | Ht 62.0 in | Wt 214.8 lb

## 2022-09-12 DIAGNOSIS — G43009 Migraine without aura, not intractable, without status migrainosus: Secondary | ICD-10-CM | POA: Diagnosis not present

## 2022-09-12 DIAGNOSIS — G932 Benign intracranial hypertension: Secondary | ICD-10-CM | POA: Diagnosis not present

## 2022-09-12 MED ORDER — TOPIRAMATE 100 MG PO TABS: 100.0000 mg | ORAL_TABLET | Freq: Every day | ORAL | 6 refills | Status: DC

## 2022-09-12 NOTE — Progress Notes (Signed)
   CC:  headaches  Follow-up Visit  Last visit: 04/25/22  Brief HPI: 27 year old female with a history of migraines who follows in clinic for IIH. MRI brain 07/2021 was unremarkable. LP showed an elevated opening pressure of 27 and she was started on Diamox.   At her last visit, Topamax was increased to 50/100 and she was continued on Maxalt 10 mg PRN for migraine rescue.  Interval History: Headaches have been well-controlled on Topamax. Had one migraine this month which resolved with Maxalt. She has not had any issues with her vision. She has noticed increased dizziness in the past few weeks, especially when changing positions quickly. She saw ophthalmology in February who noted no papilledema on her exam.   Migraine days per month: 1 Headache free days per month: 29  Current Headache Regimen: Preventative: Topamax 50/100 Abortive: Maxalt 10 mg PRN   Prior Therapies                                  Diamox 500/1000  Topamax 100 mg QHS Maxalt 10 mg PRN  Physical Exam:   Vital Signs: BP (!) 138/90 (BP Location: Right Arm, Patient Position: Sitting, Cuff Size: Normal)   Pulse 85   Ht 5\' 2"  (1.575 m)   Wt 214 lb 12.8 oz (97.4 kg)   BMI 39.29 kg/m  GENERAL:  well appearing, in no acute distress, alert  SKIN:  Color, texture, turgor normal. No rashes or lesions HEAD:  Normocephalic/atraumatic. RESP: normal respiratory effort MSK:  No gross joint deformities.   NEUROLOGICAL: Mental Status: Alert, oriented to person, place and time, Follows commands, and Speech fluent and appropriate. Cranial Nerves: PERRL, face symmetric, no dysarthria, hearing grossly intact Motor: moves all extremities equally Gait: normal-based.  IMPRESSION: 27 year old female who presents for follow up of migraines and IIH. Headaches are well-controlled on Topamax 50/100 but she has noticed increased dizziness with the higher dose. Eye exam last month was negative for papilledema. Will decrease Topamax  to 100 mg QHS and continue Maxalt for migraine rescue.  PLAN: -Prevention: Decrease Topamax to 100 mg QHS -Rescue: Continue Maxalt 10 mg PRN   Follow-up: 8 months  I spent a total of 16 minutes on the date of the service. Headache education was done. Discussed treatment options including preventive and acute medications. Discussed medication side effects, adverse reactions and drug interactions. Written educational materials and patient instructions outlining all of the above were given.  Genia Harold, MD 09/12/22 3:52 PM

## 2022-09-12 NOTE — Patient Instructions (Signed)
Decrease Topamax to 100 mg at bedtime

## 2022-10-03 ENCOUNTER — Encounter: Payer: Self-pay | Admitting: Family Medicine

## 2022-10-03 ENCOUNTER — Ambulatory Visit (INDEPENDENT_AMBULATORY_CARE_PROVIDER_SITE_OTHER): Payer: Commercial Managed Care - HMO | Admitting: Family Medicine

## 2022-10-03 VITALS — BP 124/74 | HR 81 | Temp 97.6°F | Resp 18 | Ht 62.0 in | Wt 215.6 lb

## 2022-10-03 DIAGNOSIS — Z1329 Encounter for screening for other suspected endocrine disorder: Secondary | ICD-10-CM

## 2022-10-03 DIAGNOSIS — Z130 Encounter for screening for diseases of the blood and blood-forming organs and certain disorders involving the immune mechanism: Secondary | ICD-10-CM | POA: Diagnosis not present

## 2022-10-03 DIAGNOSIS — Z13220 Encounter for screening for lipoid disorders: Secondary | ICD-10-CM | POA: Diagnosis not present

## 2022-10-03 DIAGNOSIS — Z0000 Encounter for general adult medical examination without abnormal findings: Secondary | ICD-10-CM

## 2022-10-03 DIAGNOSIS — R5383 Other fatigue: Secondary | ICD-10-CM

## 2022-10-03 DIAGNOSIS — Z131 Encounter for screening for diabetes mellitus: Secondary | ICD-10-CM

## 2022-10-03 DIAGNOSIS — F411 Generalized anxiety disorder: Secondary | ICD-10-CM

## 2022-10-03 LAB — CBC
HCT: 39.3 % (ref 36.0–46.0)
Hemoglobin: 13.2 g/dL (ref 12.0–15.0)
MCHC: 33.6 g/dL (ref 30.0–36.0)
MCV: 89.2 fl (ref 78.0–100.0)
Platelets: 312 10*3/uL (ref 150.0–400.0)
RBC: 4.41 Mil/uL (ref 3.87–5.11)
RDW: 13.3 % (ref 11.5–15.5)
WBC: 9.4 10*3/uL (ref 4.0–10.5)

## 2022-10-03 LAB — COMPREHENSIVE METABOLIC PANEL
ALT: 10 U/L (ref 0–35)
AST: 14 U/L (ref 0–37)
Albumin: 4.5 g/dL (ref 3.5–5.2)
Alkaline Phosphatase: 61 U/L (ref 39–117)
BUN: 11 mg/dL (ref 6–23)
CO2: 22 mEq/L (ref 19–32)
Calcium: 8.8 mg/dL (ref 8.4–10.5)
Chloride: 108 mEq/L (ref 96–112)
Creatinine, Ser: 0.72 mg/dL (ref 0.40–1.20)
GFR: 115.06 mL/min (ref >60.00)
Glucose, Bld: 89 mg/dL (ref 70–99)
Potassium: 4.4 mEq/L (ref 3.5–5.1)
Sodium: 139 mEq/L (ref 135–145)
Total Bilirubin: 0.4 mg/dL (ref 0.2–1.2)
Total Protein: 7.1 g/dL (ref 6.0–8.3)

## 2022-10-03 LAB — VITAMIN D 25 HYDROXY (VIT D DEFICIENCY, FRACTURES): VITD: 22.72 ng/mL — ABNORMAL LOW (ref 30.00–100.00)

## 2022-10-03 LAB — LIPID PANEL
Cholesterol: 163 mg/dL (ref 0–200)
HDL: 36.5 mg/dL — ABNORMAL LOW (ref >39.00)
LDL Cholesterol: 108 mg/dL — ABNORMAL HIGH (ref 0–99)
NonHDL: 126.55
Total CHOL/HDL Ratio: 4
Triglycerides: 95 mg/dL (ref 0.0–149.0)
VLDL: 19 mg/dL (ref 0.0–40.0)

## 2022-10-03 LAB — TSH: TSH: 3.78 u[IU]/mL (ref 0.35–5.50)

## 2022-10-03 LAB — HEMOGLOBIN A1C: Hgb A1c MFr Bld: 5.3 % (ref 4.6–6.5)

## 2022-10-03 MED ORDER — FLUOXETINE HCL 20 MG PO CAPS: 20.0000 mg | ORAL_CAPSULE | Freq: Every day | ORAL | 3 refills | Status: DC

## 2022-10-03 NOTE — Progress Notes (Signed)
[facility] [address], [address] [place], [place] [address] [phone]  Date:  10/03/2022   Name:  Jennifer Yoder   DOB:  1996-05-17   MRN:  [id number]  PCP:  Pearline Cables, MD    Chief Complaint: Annual Exam (Concerns/ questions: none/Tdap: UC in [place] a couple of years ago/HPV: none/Hep C/ HIV screens due today)   History of Present Illness:  Jennifer Yoder is a 27 y.o. very pleasant female patient who presents with the following:  Patient seen today for physical exam Most recent visit with myself was in January when she was concerned about a breast mass We obtained breast imaging-ultrasound, in February which was benign.  They recommended screening mammogram age 27 She notes the breast area seems to have gone back to normal  Also history of migraine headaches and idiopathic intracranial hypertension.  She was seen by neurology last month MRI of her brain in February 2023 was normal, she did have an elevated opening pressure on LP at that time and was previously taking Diamox.  She is now maintained on Topamax with as needed Maxalt At her visit last months Dr. Delena Bali decreased her dose of Topamax due to some dizziness-she is now taking 100 mg at bedtime  Tetanus booster- this was done about 2 years ago per UC, she will try to find a date for me HPV-not done yet, recommended -patient wants to think about this  Recommend COVID booster Pap smear is up-to-date-completed June 2023, negative Can update routine lab work today She declines STI screening today  She does have history of vitamin D def- will check on this for her today   She does note she is feeling anxious about things more recently She has noted this for a few years, gradually getting worse especially the last 6 months or so They are planning a wedding, there is a lot going on which makes her feel overwhelmed Also her fianc recently had what sounds  like gallstone pancreatitis and was fairly sick.  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  No past surgical history on file.  Social History   Tobacco Use   Smoking status: Never   Smokeless tobacco: Never  Substance Use Topics   Alcohol use: Not Currently   Drug use: Never    No family history on file.  No Known Allergies  Medication list has been reviewed and updated.  Current Outpatient Medications on File Prior to Visit  Medication Sig Dispense Refill   hydrOXYzine (ATARAX) 10 MG tablet Take 1-2 tablets (10-20 mg total) by mouth 3 (three) times daily as needed for anxiety. 40 tablet 0   rizatriptan (MAXALT) 10 MG tablet TAKE 1 TABLET BY MOUTH AS NEEDED FOR MIGRAINE. MAY REPEAT IN 2 HOURS IF NEEDED 10 tablet 1   topiramate (TOPAMAX) 100 MG tablet Take 1 tablet (100 mg total) by mouth at bedtime.  30 tablet 6   Vitamin D, Ergocalciferol, (DRISDOL) 1.25 MG (50000 UNIT) CAPS capsule Take 1 capsule (50,000 Units total) by mouth every 7 (seven) days. 12 capsule 0   No current facility-administered medications on file prior to visit.    Review of Systems:  As per HPI- otherwise negative.   Physical Examination: Vitals:   10/03/22 0950  BP: 124/74  Pulse: 81  Resp: 18  Temp: 97.6 F (36.4 C)  SpO2: 97%   Vitals:   10/03/22 0950  Weight: 215 lb 9.6 oz (97.8 kg)  Height:  (1.575 m)   Body mass index is 39.43 kg/m. Ideal Body Weight: Weight in (lb) to have BMI  = 25: 136.4  GEN: no acute distress.  Obese, looks well HEENT: Atraumatic, Normocephalic.  Bilateral TM wnl, oropharynx normal.  PEERL,EOMI.   Ears and Nose: No external deformity. CV: RRR, No M/G/R. No JVD. No thrill. No extra heart sounds. PULM: CTA B, no wheezes, crackles, rhonchi. No retractions. No resp. distress. No accessory muscle use. ABD: S, NT, ND, +BS. No rebound. No HSM. EXTR: No c/c/e PSYCH: Normally interactive. Conversant.    Assessment and Plan: Physical exam  Thyroid disorder screening - Plan: TSH  Screening for diabetes mellitus - Plan: Comprehensive metabolic panel, Hemoglobin A1c  Screening for deficiency anemia - Plan: CBC  Fatigue, unspecified type - Plan: VITAMIN D 25 Hydroxy (Vit-D Deficiency, Fractures)  Screening, lipid - Plan: Lipid panel  GAD (generalized anxiety disorder) - Plan: FLUoxetine (PROZAC) 20 MG capsule  Physical exam today.  Encouraged healthy diet and exercise routine She has been struggling with symptoms of anxiety, less so depression for the last few months.  No suicidal ideation.  She is interested in using medication to help with her symptoms.  She has used hydroxyzine which helps, but it makes her very drowsy-she cannot really use it during the day We will start her on fluoxetine 20, can increase to 40 in about 2 weeks as needed.  I have asked her to keep me posted about how this is working for her  Keep up the good work with walking for exercise  If you can, please try to find the date of your most recent tetanus shot so I can put it in your chart  I would also recommend getting the Gardasil/HPV vaccine series.  This helps protect you against HPV virus which can cause cervical cancer and also genital warts-we can save you some trouble down the line (such as abnormal pap which can be associated with very unpleasant treatment)!   I am sorry you have been dealing with this anxiety.  Lets have you start on fluoxetine 20 mg.  Take this  once daily, anytime of day that suits you is fine.  If you like, you can increase to 2 tablets / 40 mg in a couple of weeks.  Please let me know how it seems to be working for you over the next few weeks Signed Abbe Amsterdam, MD  Received labs as below, message to patient  Results for orders placed or performed in visit on 10/03/22  CBC  Result Value Ref Range   WBC 9.4 4.0 - 10.5 K/uL   RBC 4.41 3.87 - 5.11 Mil/uL   Platelets 312.0 150.0 - 400.0 K/uL   Hemoglobin 13.2 12.0 - 15.0 g/dL   HCT 16.1 09.6 - 04.5 %   MCV 89.2 78.0 - 100.0 fl   MCHC 33.6 30.0 - 36.0 g/dL   RDW 40.9 81.1 -  15.5 %  Comprehensive metabolic panel  Result Value Ref Range   Sodium 139 135 - 145 mEq/L   Potassium 4.4 3.5 - 5.1 mEq/L   Chloride 108 96 - 112 mEq/L   CO2 22 19 - 32 mEq/L   Glucose, Bld 89 70 - 99 mg/dL   BUN 11 6 - 23 mg/dL   Creatinine, Ser 1.61 0.40 - 1.20 mg/dL   Total Bilirubin 0.4 0.2 - 1.2 mg/dL   Alkaline Phosphatase 61 39 - 117 U/L   AST 14 0 - 37 U/L   ALT 10 0 - 35 U/L   Total Protein 7.1 6.0 - 8.3 g/dL   Albumin 4.5 3.5 - 5.2 g/dL   GFR 096.04 >54.09 mL/min   Calcium 8.8 8.4 - 10.5 mg/dL  Hemoglobin W1X  Result Value Ref Range   Hgb A1c MFr Bld 5.3 4.6 - 6.5 %  Lipid panel  Result Value Ref Range   Cholesterol 163 0 - 200 mg/dL   Triglycerides 91.4 0.0 - 149.0 mg/dL   HDL 78.29 (L) >56.21 mg/dL   VLDL 30.8 0.0 - 65.7 mg/dL   LDL Cholesterol 846 (H) 0 - 99 mg/dL   Total CHOL/HDL Ratio 4    NonHDL 126.55   TSH  Result Value Ref Range   TSH 3.78 0.35 - 5.50 uIU/mL  VITAMIN D 25 Hydroxy (Vit-D Deficiency, Fractures)  Result Value Ref Range   VITD 22.72 (L) 30.00 - 100.00 ng/mL

## 2022-10-03 NOTE — Patient Instructions (Addendum)
It was good to see you again today, I will be in touch with your labs soon as possible  Keep up the good work with walking for exercise  If you can, please try to find the date of your most recent tetanus shot so I can put it in your chart  I would also recommend getting the Gardasil/HPV vaccine series.  This helps protect you against HPV virus which can cause cervical cancer and also genital warts-we can save you some trouble down the line (such as abnormal pap which can be associated with very unpleasant treatment)!   I am sorry you have been dealing with this anxiety.  Lets have you start on fluoxetine 20 mg.  Take this once daily, anytime of day that suits you is fine.  If you like, you can increase to 2 tablets / 40 mg in a couple of weeks.  Please let me know how it seems to be working for you over the next few weeks

## 2022-10-16 ENCOUNTER — Other Ambulatory Visit: Payer: Self-pay | Admitting: Psychiatry

## 2022-10-16 NOTE — Telephone Encounter (Signed)
Pt last seen on 09/12/22 per note "-Rescue: Continue Maxalt 10 mg PRN "  Rx last filled on 10/03/22 #10 tablets ( 30 day supply)

## 2023-01-30 ENCOUNTER — Encounter: Payer: Self-pay | Admitting: Psychiatry

## 2023-01-30 ENCOUNTER — Telehealth: Payer: Self-pay | Admitting: Psychiatry

## 2023-01-30 NOTE — Telephone Encounter (Signed)
LVM and sent letter in mail informing pt of need to reschedule 05/19/23 appt - MD leaving practice

## 2023-02-05 ENCOUNTER — Other Ambulatory Visit: Payer: Self-pay | Admitting: Family Medicine

## 2023-02-05 DIAGNOSIS — F411 Generalized anxiety disorder: Secondary | ICD-10-CM

## 2023-03-03 ENCOUNTER — Other Ambulatory Visit: Payer: Self-pay | Admitting: Family Medicine

## 2023-03-03 DIAGNOSIS — F419 Anxiety disorder, unspecified: Secondary | ICD-10-CM

## 2023-03-03 MED ORDER — HYDROXYZINE HCL 10 MG PO TABS
10.0000 mg | ORAL_TABLET | Freq: Three times a day (TID) | ORAL | 0 refills | Status: DC | PRN
Start: 2023-03-03 — End: 2024-03-15

## 2023-04-24 ENCOUNTER — Telehealth: Payer: Self-pay | Admitting: Psychiatry

## 2023-04-24 MED ORDER — TOPIRAMATE 100 MG PO TABS: 100.0000 mg | ORAL_TABLET | Freq: Every day | ORAL | 1 refills | Status: DC

## 2023-04-24 NOTE — Telephone Encounter (Signed)
Pt request refill for topiramate (TOPAMAX) 100 MG tablet  send to  [facility] [phone]

## 2023-04-24 NOTE — Telephone Encounter (Signed)
E-scribed refill 

## 2023-05-22 ENCOUNTER — Ambulatory Visit: Payer: Commercial Managed Care - HMO | Admitting: Psychiatry

## 2023-05-26 NOTE — Progress Notes (Unsigned)
CC:  headaches  Follow-up Visit  Last visit: 09/12/2022  Brief HPI: 27 year old female with a history of migraines who follows in clinic for IIH. MRI brain 07/2021 was unremarkable. LP showed an elevated opening pressure of 27 and she was started on Diamox but switched to topiramate due to lack of benefit with Diamox.   At her last visit, reports great improvement of migraines and benefit with rizatriptan but noted increase dizziness therefore topiramate was decreased to 100mg  qhs.  Ophthalmology evaluation in February without evidence of papilledema.    Interval History:     Headaches have been well-controlled on Topamax. Had one migraine this month which resolved with Maxalt. She has not had any issues with her vision. She has noticed increased dizziness in the past few weeks, especially when changing positions quickly. She saw ophthalmology in February who noted no papilledema on her exam.   Migraine days per month: 1 Headache free days per month: 29  Current Headache Regimen: Preventative: Topamax 50/100 Abortive: Maxalt 10 mg PRN   Prior Therapies                                  Diamox 500/1000  Topamax 100 mg QHS Maxalt 10 mg PRN  Current Outpatient Medications on File Prior to Visit  Medication Sig Dispense Refill   FLUoxetine (PROZAC) 20 MG capsule TAKE 1 CAPSULE BY MOUTH DAILY *INCREASE TO 2 CAPS DAILY AFTER 2 WEEKS IF DESIRED 60 capsule 3   hydrOXYzine (ATARAX) 10 MG tablet Take 1-2 tablets (10-20 mg total) by mouth 3 (three) times daily as needed for anxiety. 40 tablet 0   rizatriptan (MAXALT) 10 MG tablet TAKE 1 TABLET BY MOUTH AS NEEDED FOR MIGRAINE. MAY REPEAT IN 2 HOURS IF NEEDED 10 tablet 1   topiramate (TOPAMAX) 100 MG tablet Take 1 tablet (100 mg total) by mouth at bedtime. 30 tablet 1   Vitamin D, Ergocalciferol, (DRISDOL) 1.25 MG (50000 UNIT) CAPS capsule Take 1 capsule (50,000 Units total) by mouth every 7 (seven) days. 12 capsule 0   No current  facility-administered medications on file prior to visit.   No past surgical history on file.  No past medical history on file.       Physical Exam:   Vital Signs: There were no vitals taken for this visit. GENERAL:  well appearing, in no acute distress, alert  SKIN:  Color, texture, turgor normal. No rashes or lesions HEAD:  Normocephalic/atraumatic. RESP: normal respiratory effort MSK:  No gross joint deformities.   NEUROLOGICAL: Mental Status: Alert, oriented to person, place and time, Follows commands, and Speech fluent and appropriate. Cranial Nerves: PERRL, face symmetric, no dysarthria, hearing grossly intact Motor: moves all extremities equally Gait: normal-based.     IMPRESSION: 27 year old female who presents for follow up of migraines and IIH.   Headaches are well-controlled on Topamax 50/100 but she has noticed increased dizziness with the higher dose. Eye exam last month was negative for papilledema. Will decrease Topamax to 100 mg QHS and continue Maxalt for migraine rescue.    PLAN: -Prevention: Decrease Topamax to 100 mg QHS -Rescue: Continue Maxalt 10 mg PRN     I spent *** minutes of face-to-face and non-face-to-face time with patient.  This included previsit chart review, lab review, study review, order entry, electronic health record documentation, patient education and discussion regarding above diagnoses and treatment plan and answered all  other questions to patient's satisfaction  Ihor Austin, [facility]  [facility] [address] [place], [place] [address]  Phone [phone] Fax [phone] Note: This document was prepared with digital dictation and possible smart phrase technology. Any transcriptional errors that result from this process are unintentional.

## 2023-05-27 ENCOUNTER — Ambulatory Visit: Payer: Commercial Managed Care - HMO | Admitting: Adult Health

## 2023-06-03 IMAGING — XA DG SPINAL PUNCT LUMBAR DIAG WITH FL CT GUIDANCE
1 series · 1 of 1 positions shown · non-contrast
Comparison: none

CLINICAL DATA: Headache, possible idiopathic intracranial
hypertension

[Series 1: ortho adipose · 1 of 1 slices shown]
[im 1/1]
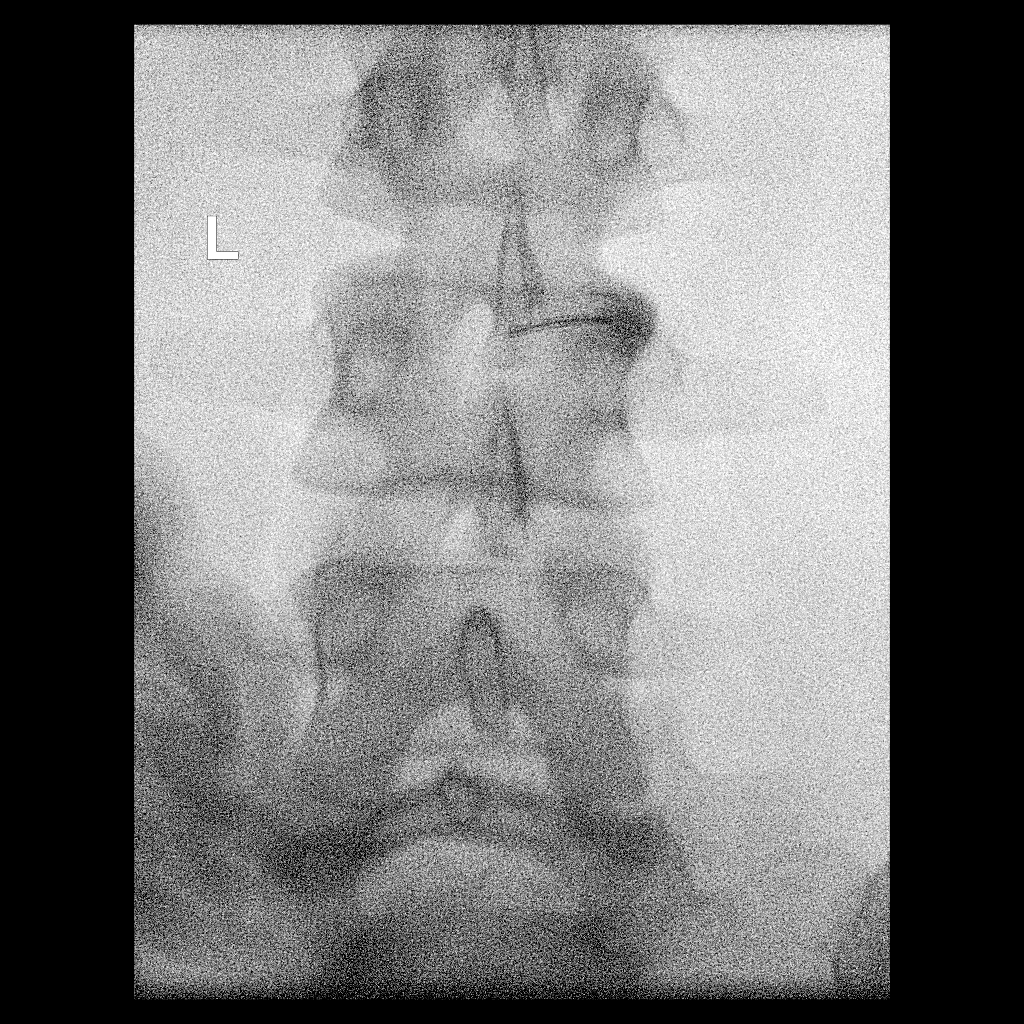

[1 of 1 positions shown; findings below may reference images not displayed]

EXAM:
DIAGNOSTIC LUMBAR PUNCTURE UNDER FLUOROSCOPIC GUIDANCE

FLUOROSCOPY TIME:  Radiation Exposure Index (as provided by the
fluoroscopic device): 1.8 mGy air Kerma

PROCEDURE:
Informed consent was obtained from the patient prior to the
procedure, including potential complications of headache, allergy,
and pain. With the patient prone, the lower back was prepped with
Betadine. 1% Lidocaine was used for local anesthesia. Lumbar
puncture was performed at the L4 level using a 20 gauge needle from
a right parasagittal approach with return of clear colorless CSF
with an opening pressure of 27 cm water in left lateral decubitus.
20 ml of CSF were obtained for laboratory studies. Closing pressure
13 cm H2O. The patient tolerated the procedure well and there were
no apparent complications.
IMPRESSION: 1. Technically successful lumbar puncture under fluoroscopy.
2. Opening pressure 27 cm H2O, closing 13 cm H2O after removal of 20
mL.

## 2023-06-05 ENCOUNTER — Ambulatory Visit: Payer: Commercial Managed Care - HMO | Admitting: Adult Health

## 2023-06-20 ENCOUNTER — Other Ambulatory Visit: Payer: Self-pay | Admitting: Neurology

## 2023-06-24 NOTE — Telephone Encounter (Signed)
 Pt checking status of refill request from pharmacy. States she only has 1 pill left. Requesting call back

## 2023-06-25 ENCOUNTER — Other Ambulatory Visit: Payer: Self-pay | Admitting: *Deleted

## 2023-06-25 NOTE — Telephone Encounter (Signed)
 Pt checking status of refill. States she is completely out. Reached out to [facility] who states they will refill request but pt must keep her upcoming appt. Advised pt who understood

## 2023-07-03 NOTE — Progress Notes (Signed)
CC:  headaches  Follow-up Visit  Last visit: 09/12/2022 Dr. 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 MRI brain 07/2021 was unremarkable. LP showed an elevated opening pressure of 27 and she was started on Diamox but eventually switched to topiramate due to persistent headaches.  At her last visit, topiramate was decreased back to 100mg  at bedtime due to complaints of dizziness on higher dosage and continued on rizatriptan for rescue.  Reports recent visit with ophthalmology who noted no papilledema on exam.   Interval History:  Patient returns for follow-up visit.  She remains on topiramate 100 mg nightly which she has been tolerating well.  She is no longer having dizziness concerns.  She does report continued 5 migraine days per month, use of rizatriptan with benefit.  Does report taking Advil at least 3 times per week "for a long time" with hopes of further reducing headaches. Reports being seen by ophthalmology within the past year without evidence of papilledema and was told no need of routine follow-up visits.  Denies any changes in her vision, denies tinnitus.     Migraine days per month: 5 Headache free days per month: 25  Current Headache Regimen: Preventative: Topamax 100mg  qhs Abortive: Maxalt 10 mg PRN   Prior Therapies                                  Diamox 500/1000  Topamax 50/100 - dizziness Maxalt 10 mg PRN Fluoxetine Hydroxyzine     Outpatient Encounter Medications as of 07/04/2023  Medication Sig   FLUoxetine (PROZAC) 20 MG capsule TAKE 1 CAPSULE BY MOUTH DAILY *INCREASE TO 2 CAPS DAILY AFTER 2 WEEKS IF DESIRED   hydrOXYzine (ATARAX) 10 MG tablet Take 1-2 tablets (10-20 mg total) by mouth 3 (three) times daily as needed for anxiety.   propranolol (INDERAL) 20 MG tablet Take 1 tablet (20 mg total) by mouth 2 (two) times daily.   Vitamin D, Ergocalciferol, (DRISDOL) 1.25 MG (50000 UNIT) CAPS capsule  Take 1 capsule (50,000 Units total) by mouth every 7 (seven) days.   [DISCONTINUED] rizatriptan (MAXALT) 10 MG tablet TAKE 1 TABLET BY MOUTH AS NEEDED FOR MIGRAINE. MAY REPEAT IN 2 HOURS IF NEEDED   [DISCONTINUED] topiramate (TOPAMAX) 100 MG tablet TAKE 1 TABLET BY MOUTH EVERYDAY AT BEDTIME   rizatriptan (MAXALT) 10 MG tablet Take 1 tablet (10 mg total) by mouth as needed for migraine. May repeat in 2 hours if needed   topiramate (TOPAMAX) 100 MG tablet Take 1 tablet (100 mg total) by mouth at bedtime.   No facility-administered encounter medications on file as of 07/04/2023.   History reviewed. No pertinent past medical history.  History reviewed. No pertinent surgical history.      Physical Exam:   Vital Signs: BP 131/83 (Cuff Size: Normal)   Pulse 95   Ht 5\' 2"  (1.575 m)   Wt 221 lb (100.2 kg)   BMI 40.42 kg/m  GENERAL:  well appearing, very pleasant young Caucasian female, in no acute distress, alert  SKIN:  Color, texture, turgor normal. No rashes or lesions HEAD:  Normocephalic/atraumatic. RESP: normal respiratory effort MSK:  No gross joint deformities.   NEUROLOGICAL: Mental Status: Alert, oriented to person, place and time, Follows commands, and Speech fluent and appropriate. Cranial Nerves: PERRL, face symmetric, no dysarthria, hearing grossly intact Motor: moves all extremities equally  Gait: normal-based.     IMPRESSION: 28 year old female who returns for follow up of migraines and IIH.  Prior eye exam without evidence of papilledema.  Continues to experience about 5 migraines per month, use of rizatriptan with benefit.  Reports frequent use of Advil (at least 3x weekly).    PLAN: -Prevention: Recommend starting propranolol 20 mg twice daily, advised to call after 1 to 2 weeks if no benefit or sooner if difficulty tolerating.  Continue Topamax 100 mg QHS -Rescue: Continue Maxalt 10 mg PRN -Next steps: CR GP, Botox -Discussed limiting use of OTC pain relievers  as this can cause rebound headache    Follow-up in 6 months or call earlier if needed     I spent 25 minutes of face-to-face and non-face-to-face time with patient.  This included previsit chart review, lab review, study review, order entry, electronic health record documentation, patient education and discussion regarding above diagnoses and treatment plan and answered all other questions to patient's satisfaction  Ihor Austin, [facility]  [facility] [address] [place], [place] [address]  Phone [phone] Fax [phone] Note: This document was prepared with digital dictation and possible smart phrase technology. Any transcriptional errors that result from this process are unintentional.

## 2023-07-04 ENCOUNTER — Ambulatory Visit: Payer: Commercial Managed Care - HMO | Admitting: Adult Health

## 2023-07-04 ENCOUNTER — Encounter: Payer: Self-pay | Admitting: Adult Health

## 2023-07-04 VITALS — BP 131/83 | HR 95 | Ht 62.0 in | Wt 221.0 lb

## 2023-07-04 DIAGNOSIS — G932 Benign intracranial hypertension: Secondary | ICD-10-CM | POA: Diagnosis not present

## 2023-07-04 DIAGNOSIS — G43009 Migraine without aura, not intractable, without status migrainosus: Secondary | ICD-10-CM | POA: Diagnosis not present

## 2023-07-04 MED ORDER — PROPRANOLOL HCL 20 MG PO TABS
20.0000 mg | ORAL_TABLET | Freq: Two times a day (BID) | ORAL | 11 refills | Status: DC
Start: 2023-07-04 — End: 2024-03-15

## 2023-07-04 MED ORDER — TOPIRAMATE 100 MG PO TABS: 100.0000 mg | ORAL_TABLET | Freq: Every day | ORAL | 11 refills | Status: DC

## 2023-07-04 MED ORDER — RIZATRIPTAN BENZOATE 10 MG PO TABS: 10.0000 mg | ORAL_TABLET | ORAL | 11 refills | Status: DC | PRN

## 2023-07-04 NOTE — Patient Instructions (Addendum)
Your Plan:  Start propranolol 20mg  twice daily - please call after 1-2 weeks if no benefit for dosage increase or sooner if difficulty tolerating   Continue topamax 100mg  nightly  Continue rizatriptan as needed  Limit use of Advil or any other over the counter pain relievers as this can cause rebound headaches - do not use more than 10 times per month     Follow up in 6 months or call earlier if needed      Thank you for coming to see Jennifer Yoder at [facility]. I hope we have been able to provide you high quality care today.  You may receive a patient satisfaction survey over the next few weeks. We would appreciate your feedback and comments so that we may continue to improve ourselves and the health of our patients.     Propranolol Tablets What is this medication? PROPRANOLOL (proe PRAN oh lole) treats many conditions such as high blood pressure, tremors, and a type of arrhythmia known as AFib (atrial fibrillation). It works by lowering your blood pressure and heart rate, making it easier for your heart to pump blood to the rest of your body. It may be used to prevent migraine headaches. It works by relaxing the blood vessels in the brain that cause migraines. It belongs to a group of medications called beta blockers. This medicine may be used for other purposes; ask your health care provider or pharmacist if you have questions. COMMON BRAND NAME(S): Inderal What should I tell my care team before I take this medication? They need to know if you have any of these conditions: Diabetes Having surgery Heart or blood vessel conditions, such as slow heartbeat, heart failure, heart block Kidney disease Liver disease Lung or breathing disease, such as asthma or COPD Myasthenia gravis Pheochromocytoma Thyroid disease An unusual or allergic reaction to propranolol, other medications, foods, dyes, or preservatives Pregnant or trying to get pregnant Breastfeeding How  should I use this medication? Take this medication by mouth. Take it as directed on the prescription label at the same time every day. Keep taking it unless your care team tells you to stop. Talk to your care team about the use of this medication in children. Special care may be needed. Overdosage: If you think you have taken too much of this medicine contact a poison control center or emergency room at once. NOTE: This medicine is only for you. Do not share this medicine with others. What if I miss a dose? If you miss a dose, take it as soon as you can. If it is almost time for your next dose, take only that dose. Do not take double or extra doses. What may interact with this medication? Do not take this medication with any of the following: Thioridazine This medication may also interact with the following: Certain medications for blood pressure, heart disease, irregular heartbeat Epinephrine NSAIDs, medications for pain and inflammation, such as ibuprofen or naproxen Warfarin Other medications may affect the way this medication works. Talk with your care team about all of the medications you take. They may suggest changes to your treatment plan to lower the risk of side effects and to make sure your medications work as intended. This list may not describe all possible interactions. Give your health care provider a list of all the medicines, herbs, non-prescription drugs, or dietary supplements you use. Also tell them if you smoke, drink alcohol, or use illegal drugs. Some items may interact with your medicine. What  should I watch for while using this medication? Visit your care team for regular checks on your progress. Check your blood pressure as directed. Know what your blood pressure should be and when to contact your care team. This medication may affect your coordination, reaction time, or judgment. Do not drive or operate machinery until you know how this medication affects you. Sit up or  stand slowly to reduce the risk of dizzy or fainting spells. Drinking alcohol with this medication can increase the risk of these side effects. Do not suddenly stop taking this medication. This may increase your risk of side effects, such as chest pain and heart attack. If you no longer need to take this medication, your care team will lower the dose slowly over time to decrease the risk of side effects. If you are going to need surgery or a procedure, tell your care team that you are using this medication. This medication may affect blood glucose levels. It can also mask the symptoms of low blood sugar, such as a rapid heartbeat and tremors. If you have diabetes, it is important to check your blood sugar often while you are taking this medication. Do not treat yourself for coughs, colds, or pain while you are using this medication without asking your care team for advice. Some medications may increase your blood pressure. What side effects may I notice from receiving this medication? Side effects that you should report to your care team as soon as possible: Allergic reactions--skin rash, itching, hives, swelling of the face, lips, tongue, or throat Heart failure--shortness of breath, swelling of the ankles, feet, or hands, sudden weight gain, unusual weakness or fatigue Low blood pressure--dizziness, feeling faint or lightheaded, blurry vision Raynaud's--cool, numb, or painful fingers or toes that may change color from pale, to blue, to red Redness, blistering, peeling, or loosening of the skin, including inside the mouth Slow heartbeat--dizziness, feeling faint or lightheaded, confusion, trouble breathing, unusual weakness or fatigue Worsening mood, feelings of depression Side effects that usually do not require medical attention (report to your care team if they continue or are bothersome): Change in sex drive or performance Diarrhea Dizziness Fatigue Headache This list may not describe all  possible side effects. Call your doctor for medical advice about side effects. You may report side effects to FDA at 1-800-FDA-1088. Where should I keep my medication? Keep out of the reach of children and pets. Store at room temperature between 20 and 25 degrees C (68 and 77 degrees F). Protect from light. Throw away any unused medication after the expiration date. NOTE: This sheet is a summary. It may not cover all possible information. If you have questions about this medicine, talk to your doctor, pharmacist, or health care provider.  2024 Elsevier/Gold Standard (2022-06-03 00:00:00)

## 2023-08-10 ENCOUNTER — Other Ambulatory Visit: Payer: Self-pay | Admitting: Family Medicine

## 2023-08-10 DIAGNOSIS — F411 Generalized anxiety disorder: Secondary | ICD-10-CM

## 2023-08-12 ENCOUNTER — Encounter: Payer: Self-pay | Admitting: *Deleted

## 2023-10-02 ENCOUNTER — Other Ambulatory Visit: Payer: Self-pay | Admitting: Adult Health

## 2023-10-09 ENCOUNTER — Other Ambulatory Visit: Payer: Self-pay | Admitting: Neurology

## 2023-10-09 MED ORDER — ZOLMITRIPTAN 5 MG PO TABS: 5.0000 mg | ORAL_TABLET | ORAL | 0 refills | Status: DC | PRN

## 2023-12-08 NOTE — Patient Instructions (Signed)
 It was good to see you again today, I will be in touch with your labs  Please set up a visit with your GI doc- Dr Katrinka Blazing - for your screening colonoscopy  [phone]  Please check with your insurance about coverage of GLP-1 agonist drugs for weight loss.  These drugs would include Saxenda, Wegovy, Zepbound  Please clarify with your insurance if they cover this for weight loss as opposed to diabetes.  If one of these medications is covered I am more than happy to prescribe you

## 2023-12-08 NOTE — Progress Notes (Addendum)
 [facility] [address], [address] [place], [place] [address] [phone]  Date:  12/11/2023   Name:  Jennifer Yoder   DOB:  [birth year]   MRN:  [id number]  PCP:  Watt Harlene BROCKS, MD    Chief Complaint: Annual Exam (Pt states she stopped taking Prozac  in 09/2023)   History of Present Illness:  Jennifer Yoder is a 28 y.o. very pleasant female patient who presents with the following:  Patient seen today for physical exam. History of idiopathic intracranial hypertension, vitamin D  deficiency, migraine headache Most recent visit with myself was in April of last year-at that time she was dealing more with anxiety-we started fluoxetine . 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 MRI brain 07/2021 was unremarkable. LP showed an elevated opening pressure of 27 and she was started on Diamox  but eventually switched to topiramate  due to persistent headaches. At her last visit, topiramate  was decreased back to 100mg  at bedtime due to complaints of dizziness on higher dosage and continued on rizatriptan  for rescue.  Reports recent visit with ophthalmology who noted no papilledema on exam. Interval History: Patient returns for follow-up visit.  She remains on topiramate  100 mg nightly which she has been tolerating well.  She is no longer having dizziness concerns.  She does report continued 5 migraine days per month, use of rizatriptan  with benefit.  Does report  taking Advil at least 3 times per week for a long time with hopes of further reducing headaches. Reports being seen by ophthalmology within the past year without evidence of papilledema and was told no need of routine follow-up visits.  Denies any changes in her vision, denies tinnitus.  PLAN: -Prevention: Recommend starting propranolol  20 mg twice daily, advised to call after 1 to 2 weeks if no benefit or sooner if difficulty tolerating.  Continue Topamax  100 mg QHS -Rescue: Continue Maxalt  10 mg PRN -Next steps: CR GP, Botox -Discussed limiting use of OTC pain relievers as this can cause rebound headache  Lab work on chart from April 2024-can update today Pap 2023, negative with negative HPV Needs varicella testing- not sure if she was vaccinated We also discussed gardasil- she is thinking about this  Tetanus booster may also be due- give today - pt notes this was given actually 2 or 3 years ago at [facility]  Can offer STI screening- not needed Pt is SA with her husband- they use condoms for contraception   She is using propranolol  and topamax .  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  No past surgical history on file.  Social History   Tobacco Use   Smoking status: Never   Smokeless tobacco: Never  Substance Use Topics   Alcohol use: Not Currently   Drug use: Never    No family history on file.  No Known Allergies  Medication list has been reviewed and updated.  Current Outpatient Medications on File Prior to Visit  Medication Sig Dispense Refill   propranolol  (INDERAL ) 20 MG tablet Take 1 tablet (20 mg total) by mouth 2 (two) times daily. 60 tablet 11    topiramate  (TOPAMAX ) 100 MG tablet Take 1 tablet (100 mg total) by mouth at bedtime. 30 tablet 11   zolmitriptan  (ZOMIG ) 5 MG tablet Take 1 tablet (5 mg total) by mouth as needed for migraine (Take 1 tablet at onset of migraine. May repeat in 2 hours if needed (Do NOT exceed more than 2 tablets in a 24 hour time frame)). 10 tablet 0   hydrOXYzine  (ATARAX ) 10 MG tablet Take 1-2 tablets (10-20 mg total) by mouth 3 (three) times daily as needed for anxiety. (Patient not taking: Reported on 12/11/2023) 40 tablet 0   Vitamin D , Ergocalciferol , (DRISDOL ) 1.25 MG (50000 UNIT) CAPS capsule Take 1 capsule (50,000 Units total) by mouth every 7 (seven) days. (Patient not taking: Reported on 12/11/2023) 12 capsule 0   No current facility-administered medications on file prior to visit.    Review of Systems:  As per HPI- otherwise negative.   Physical Examination: Vitals:   12/11/23 1510  BP: 124/70  Pulse: 93  SpO2: 96%   Vitals:   12/11/23 1510  Weight: 228 lb 12.8 oz (103.8 kg)  Height: 5' 2 (1.575 m)   Body mass index is 41.85 kg/m. Ideal Body Weight: Weight in (lb) to have BMI = 25: 136.4  GEN: no acute distress.  Obese, looks well  HEENT: Atraumatic, Normocephalic.  Bilateral TM wnl, oropharynx normal.  PEERL,EOMI.   Ears and Nose: No external deformity. CV: RRR, No M/G/R. No JVD. No thrill. No extra heart sounds. PULM: CTA B, no wheezes, crackles, rhonchi. No retractions. No resp. distress. No accessory muscle use. ABD: S, NT, ND, +BS. No rebound. No HSM. EXTR: No c/c/e PSYCH: Normally interactive. Conversant.    Assessment and Plan: Physical exam  Thyroid  disorder screening - Plan: TSH  Screening for diabetes mellitus - Plan: Comprehensive metabolic panel with GFR, Hemoglobin A1c  Screening, lipid - Plan: Lipid panel  Screening for deficiency anemia - Plan: CBC  GAD (generalized anxiety disorder) - Plan: escitalopram  (LEXAPRO ) 10 MG tablet  Vitamin D  deficiency -  Plan: VITAMIN D  25 Hydroxy (Vit-D Deficiency, Fractures)  Exposure to varicella - Plan: Varicella zoster antibody, IgG  Physical exam today.  Encouraged healthy diet and exercise routine Will plan further follow- up pending labs. Will have her try lexapro    It was good to see you again today, I will be in touch with your labs asap!   We will make sure you are immune to varicella/ chicken pox Check if you ever had HPV vaccination; if you did not you might want to have this done at your convenience Try the lexapro  10 mg once daily- let me know how this works for you! Signed Harlene Schroeder, MD  Recieved labs 6/27- message to pt  Results for orders placed or performed in visit on 12/11/23  CBC   Collection Time: 12/11/23  3:47 PM  Result Value Ref Range   WBC  9.8 4.0 - 10.5 K/uL   RBC 4.56 3.87 - 5.11 Mil/uL   Platelets 336.0 150.0 - 400.0 K/uL   Hemoglobin 13.8 12.0 - 15.0 g/dL   HCT 58.4 63.9 - 53.9 %   MCV 91.0 78.0 - 100.0 fl   MCHC 33.1 30.0 - 36.0 g/dL   RDW 86.5 88.4 - 84.4 %  Comprehensive metabolic panel with GFR   Collection Time: 12/11/23  3:47 PM  Result Value Ref Range   Sodium 140 135 - 145 mEq/L   Potassium 3.6 3.5 - 5.1 mEq/L   Chloride 109 96 - 112 mEq/L   CO2 23 19 - 32 mEq/L   Glucose, Bld 87 70 - 99 mg/dL   BUN 12 6 - 23 mg/dL   Creatinine, Ser 9.33 0.40 - 1.20 mg/dL   Total Bilirubin 0.3 0.2 - 1.2 mg/dL   Alkaline Phosphatase 54 39 - 117 U/L   AST 19 0 - 37 U/L   ALT 20 0 - 35 U/L   Total Protein 7.4 6.0 - 8.3 g/dL   Albumin 4.5 3.5 - 5.2 g/dL   GFR 880.28 >39.99 mL/min   Calcium 9.1 8.4 - 10.5 mg/dL  Hemoglobin J8r   Collection Time: 12/11/23  3:47 PM  Result Value Ref Range   Hgb A1c MFr Bld 5.5 4.6 - 6.5 %  Lipid panel   Collection Time: 12/11/23  3:47 PM  Result Value Ref Range   Cholesterol 207 (H) 0 - 200 mg/dL   Triglycerides 732.9 (H) 0.0 - 149.0 mg/dL   HDL 66.49 (L) >60.99 mg/dL   VLDL 46.5 (H) 0.0 - 59.9 mg/dL   LDL Cholesterol 879  (H) 0 - 99 mg/dL   Total CHOL/HDL Ratio 6    NonHDL 173.55   TSH   Collection Time: 12/11/23  3:47 PM  Result Value Ref Range   TSH 4.29 0.35 - 5.50 uIU/mL  VITAMIN D  25 Hydroxy (Vit-D Deficiency, Fractures)   Collection Time: 12/11/23  3:47 PM  Result Value Ref Range   VITD 23.37 (L) 30.00 - 100.00 ng/mL

## 2023-12-11 ENCOUNTER — Ambulatory Visit (INDEPENDENT_AMBULATORY_CARE_PROVIDER_SITE_OTHER): Admitting: Family Medicine

## 2023-12-11 ENCOUNTER — Encounter: Payer: Self-pay | Admitting: Family Medicine

## 2023-12-11 VITALS — BP 124/70 | HR 93 | Ht 62.0 in | Wt 228.8 lb

## 2023-12-11 DIAGNOSIS — F411 Generalized anxiety disorder: Secondary | ICD-10-CM

## 2023-12-11 DIAGNOSIS — Z20820 Contact with and (suspected) exposure to varicella: Secondary | ICD-10-CM | POA: Diagnosis not present

## 2023-12-11 DIAGNOSIS — Z131 Encounter for screening for diabetes mellitus: Secondary | ICD-10-CM

## 2023-12-11 DIAGNOSIS — Z13220 Encounter for screening for lipoid disorders: Secondary | ICD-10-CM

## 2023-12-11 DIAGNOSIS — Z1329 Encounter for screening for other suspected endocrine disorder: Secondary | ICD-10-CM

## 2023-12-11 DIAGNOSIS — Z130 Encounter for screening for diseases of the blood and blood-forming organs and certain disorders involving the immune mechanism: Secondary | ICD-10-CM | POA: Diagnosis not present

## 2023-12-11 DIAGNOSIS — E559 Vitamin D deficiency, unspecified: Secondary | ICD-10-CM

## 2023-12-11 DIAGNOSIS — Z0000 Encounter for general adult medical examination without abnormal findings: Secondary | ICD-10-CM

## 2023-12-11 MED ORDER — ESCITALOPRAM OXALATE 10 MG PO TABS
10.0000 mg | ORAL_TABLET | Freq: Every day | ORAL | 3 refills | Status: DC
Start: 2023-12-11 — End: 2024-02-19

## 2023-12-12 ENCOUNTER — Encounter: Payer: Self-pay | Admitting: Family Medicine

## 2023-12-12 LAB — COMPREHENSIVE METABOLIC PANEL WITH GFR
ALT: 20 U/L (ref 0–35)
AST: 19 U/L (ref 0–37)
Albumin: 4.5 g/dL (ref 3.5–5.2)
Alkaline Phosphatase: 54 U/L (ref 39–117)
BUN: 12 mg/dL (ref 6–23)
CO2: 23 meq/L (ref 19–32)
Calcium: 9.1 mg/dL (ref 8.4–10.5)
Chloride: 109 meq/L (ref 96–112)
Creatinine, Ser: 0.66 mg/dL (ref 0.40–1.20)
GFR: 119.71 mL/min (ref >60.00)
Glucose, Bld: 87 mg/dL (ref 70–99)
Potassium: 3.6 meq/L (ref 3.5–5.1)
Sodium: 140 meq/L (ref 135–145)
Total Bilirubin: 0.3 mg/dL (ref 0.2–1.2)
Total Protein: 7.4 g/dL (ref 6.0–8.3)

## 2023-12-12 LAB — HEMOGLOBIN A1C: Hgb A1c MFr Bld: 5.5 % (ref 4.6–6.5)

## 2023-12-12 LAB — LIPID PANEL
Cholesterol: 207 mg/dL — ABNORMAL HIGH (ref 0–200)
HDL: 33.5 mg/dL — ABNORMAL LOW (ref >39.00)
LDL Cholesterol: 120 mg/dL — ABNORMAL HIGH (ref 0–99)
NonHDL: 173.55
Total CHOL/HDL Ratio: 6
Triglycerides: 267 mg/dL — ABNORMAL HIGH (ref 0.0–149.0)
VLDL: 53.4 mg/dL — ABNORMAL HIGH (ref 0.0–40.0)

## 2023-12-12 LAB — VITAMIN D 25 HYDROXY (VIT D DEFICIENCY, FRACTURES): VITD: 23.37 ng/mL — ABNORMAL LOW (ref 30.00–100.00)

## 2023-12-12 LAB — CBC
HCT: 41.5 % (ref 36.0–46.0)
Hemoglobin: 13.8 g/dL (ref 12.0–15.0)
MCHC: 33.1 g/dL (ref 30.0–36.0)
MCV: 91 fl (ref 78.0–100.0)
Platelets: 336 10*3/uL (ref 150.0–400.0)
RBC: 4.56 Mil/uL (ref 3.87–5.11)
RDW: 13.4 % (ref 11.5–15.5)
WBC: 9.8 10*3/uL (ref 4.0–10.5)

## 2023-12-12 LAB — TSH: TSH: 4.29 u[IU]/mL (ref 0.35–5.50)

## 2023-12-12 LAB — VARICELLA ZOSTER ANTIBODY, IGG: Varicella IgG: 5.45 {s_co_ratio}

## 2023-12-13 ENCOUNTER — Ambulatory Visit: Payer: Self-pay | Admitting: Family Medicine

## 2023-12-27 ENCOUNTER — Other Ambulatory Visit: Payer: Self-pay | Admitting: Family Medicine

## 2023-12-27 DIAGNOSIS — F411 Generalized anxiety disorder: Secondary | ICD-10-CM

## 2024-01-01 ENCOUNTER — Ambulatory Visit: Payer: Commercial Managed Care - HMO | Admitting: Adult Health

## 2024-02-03 ENCOUNTER — Encounter: Payer: Self-pay | Admitting: Family Medicine

## 2024-02-03 DIAGNOSIS — F411 Generalized anxiety disorder: Secondary | ICD-10-CM

## 2024-02-19 MED ORDER — ESCITALOPRAM OXALATE 20 MG PO TABS: 20.0000 mg | ORAL_TABLET | Freq: Every day | ORAL | 3 refills | Status: AC

## 2024-02-29 ENCOUNTER — Other Ambulatory Visit: Payer: Self-pay | Admitting: Adult Health

## 2024-03-15 ENCOUNTER — Encounter: Payer: Self-pay | Admitting: Adult Health

## 2024-03-15 ENCOUNTER — Ambulatory Visit: Payer: PRIVATE HEALTH INSURANCE | Admitting: Adult Health

## 2024-03-15 VITALS — BP 120/86 | HR 67 | Ht 62.0 in | Wt 239.8 lb

## 2024-03-15 DIAGNOSIS — G932 Benign intracranial hypertension: Secondary | ICD-10-CM

## 2024-03-15 DIAGNOSIS — G43009 Migraine without aura, not intractable, without status migrainosus: Secondary | ICD-10-CM

## 2024-03-15 MED ORDER — PROPRANOLOL HCL 20 MG PO TABS: 20.0000 mg | ORAL_TABLET | Freq: Two times a day (BID) | ORAL | 11 refills | Status: AC

## 2024-03-15 MED ORDER — RIZATRIPTAN BENZOATE 10 MG PO TBDP: 10.0000 mg | ORAL_TABLET | ORAL | 11 refills | Status: AC | PRN

## 2024-03-15 NOTE — Progress Notes (Signed)
 CC:  headaches  Follow-up Visit  Last visit: 07/04/2023  Brief HPI: 28 year old female with a history of migraines who follows in clinic for IIH. MRI brain 07/2021 was unremarkable. LP showed an elevated opening pressure of 27 and she was started on Diamox  but eventually switched to topiramate  due to persistent headaches.  At her last visit, initiated propranolol  20 mg twice daily in addition to topiramate  for headache prevention and continued on rizatriptan  for rescue.    Interval History:  She has reported great improvement of migraines since prior visit.  Currently experiencing about 2/month, tolerating propranolol  well as well as ongoing use of Topamax .  She was evaluated by ophthalmology on 8/27 due to migraine and felt equilibrium was off, vision exam overall satisfactory without evidence of papilledema.  She did miss dosage of topiramate  the night prior and felt this likely contributed.  For unclear reason, she was switched from rizatriptan  to zolmitriptan  back in April but denies any benefit and prefers to return back to rizatriptan  if able.  She denies continued frequent use of ibuprofen.      Migraine days per month: 2 Headache free days per month: 28  Current Headache Regimen: Preventative: Propranolol  20 mg twice daily, Topamax  100mg  qhs Abortive: Zolmitriptan    Prior Therapies                                  Diamox  500/1000  Topamax  50/100 - dizziness Maxalt  10 mg PRN Zolmitriptan  Fluoxetine  Hydroxyzine  Propranolol       ROS:   14 system review of systems performed and negative with exception of those listed in HPI   Outpatient Encounter Medications as of 03/15/2024  Medication Sig   escitalopram  (LEXAPRO ) 20 MG tablet Take 1 tablet (20 mg total) by mouth daily.   rizatriptan  (MAXALT -MLT) 10 MG disintegrating tablet Take 1 tablet (10 mg total) by mouth as needed for migraine. May repeat in 2 hours if needed   topiramate  (TOPAMAX ) 100 MG tablet TAKE 1  TABLET BY MOUTH EVERYDAY AT BEDTIME   [DISCONTINUED] propranolol  (INDERAL ) 20 MG tablet Take 1 tablet (20 mg total) by mouth 2 (two) times daily.   [DISCONTINUED] zolmitriptan  (ZOMIG ) 5 MG tablet Take 1 tablet (5 mg total) by mouth as needed for migraine (Take 1 tablet at onset of migraine. May repeat in 2 hours if needed (Do NOT exceed more than 2 tablets in a 24 hour time frame)).   propranolol  (INDERAL ) 20 MG tablet Take 1 tablet (20 mg total) by mouth 2 (two) times daily.   [DISCONTINUED] hydrOXYzine  (ATARAX ) 10 MG tablet Take 1-2 tablets (10-20 mg total) by mouth 3 (three) times daily as needed for anxiety. (Patient not taking: Reported on 12/11/2023)   [DISCONTINUED] Vitamin D , Ergocalciferol , (DRISDOL ) 1.25 MG (50000 UNIT) CAPS capsule Take 1 capsule (50,000 Units total) by mouth every 7 (seven) days. (Patient not taking: Reported on 12/11/2023)   No facility-administered encounter medications on file as of 03/15/2024.   History reviewed. No pertinent past medical history.  History reviewed. No pertinent surgical history.      Physical Exam:   Vital Signs: BP 120/86 (BP Location: Right Arm, Patient Position: Sitting, Cuff Size: Large)   Pulse 67   Ht 5' 2 (1.575 m)   Wt 239 lb 12.8 oz (108.8 kg)   BMI 43.86 kg/m  GENERAL:  well appearing, very pleasant young Caucasian female, in no acute distress, alert  SKIN:  Color, texture, turgor normal. No rashes or lesions HEAD:  Normocephalic/atraumatic. RESP: normal respiratory effort MSK:  No gross joint deformities.   NEUROLOGICAL: Mental Status: Alert, oriented to person, place and time, Follows commands, and Speech fluent and appropriate. Cranial Nerves: PERRL, face symmetric, no dysarthria, hearing grossly intact Motor: moves all extremities equally Gait: normal-based.     IMPRESSION: 28 year old female who returns for follow up of migraines and IIH.  Prior eye exam without evidence of papilledema.  Noted great improvement  headaches after initiating propranolol  with only about 2/month in addition to Topamax . Reports no benefit with zolmitriptan , did better with rizatriptan .    PLAN: -Prevention: Continue propranolol  20 mg twice daily and continue Topamax  100 mg at bedtime. She prefers to stay on current regimen at this time.  -Rescue: restart Maxalt  10 mg PRN -Next steps: CR GP, Botox - Continue to follow with ophthalmology as advised    Follow-up in 1 year via MyChart video visit or call earlier if needed    I personally spent a total of 26 minutes in the care of the patient today including preparing to see the patient, performing a medically appropriate exam/evaluation, counseling and educating, placing orders, and documenting clinical information in the EHR.   Harlene Bogaert, AGNP-BC  [facility] [address] [place], [place] [address]  Phone [phone] Fax [phone] Note: This document was prepared with digital dictation and possible smart phrase technology. Any transcriptional errors that result from this process are unintentional.

## 2024-03-15 NOTE — Patient Instructions (Addendum)
 Your Plan:  Continue propranolol  and topamax  at current dosages  Restart Maxalt  as needed for migraine rescue  Please call with any worsening migraine headaches      Follow up in 1 year or call earlier if needed       Thank you for coming to see us  at [facility]. I hope we have been able to provide you high quality care today.  You may receive a patient satisfaction survey over the next few weeks. We would appreciate your feedback and comments so that we may continue to improve ourselves and the health of our patients.

## 2024-06-28 ENCOUNTER — Telehealth: Admitting: Emergency Medicine

## 2024-06-28 ENCOUNTER — Ambulatory Visit: Payer: Self-pay

## 2024-06-28 DIAGNOSIS — K529 Noninfective gastroenteritis and colitis, unspecified: Secondary | ICD-10-CM

## 2024-06-28 MED ORDER — ONDANSETRON HCL 4 MG PO TABS: 4.0000 mg | ORAL_TABLET | Freq: Three times a day (TID) | ORAL | 0 refills | Status: DC | PRN

## 2024-06-28 NOTE — Progress Notes (Signed)
 " Virtual Visit Consent   Jennifer Yoder, you are scheduled for a virtual visit with a [facility] provider today. Just as with appointments in the office, your consent must be obtained to participate. Your consent will be active for this visit and any virtual visit you may have with one of our providers in the next 365 days. If you have a MyChart account, a copy of this consent can be sent to you electronically.  As this is a virtual visit, video technology does not allow for your provider to perform a traditional examination. This may limit your provider's ability to fully assess your condition. If your provider identifies any concerns that need to be evaluated in person or the need to arrange testing (such as labs, EKG, etc.), we will make arrangements to do so. Although advances in technology are sophisticated, we cannot ensure that it will always work on either your end or our end. If the connection with a video visit is poor, the visit may have to be switched to a telephone visit. With either a video or telephone visit, we are not always able to ensure that we have a secure connection.  By engaging in this virtual visit, you consent to the provision of healthcare and authorize for your insurance to be billed (if applicable) for the services provided during this visit. Depending on your insurance coverage, you may receive a charge related to this service.  I need to obtain your verbal consent now. Are you willing to proceed with your visit today? Jennifer Yoder has provided verbal consent on 06/28/2024 for a virtual visit (video or telephone). Jon CHRISTELLA Belt, NP  Date: 06/28/2024 6:30 PM   Virtual Visit via Video Note   I, Jon CHRISTELLA Belt, connected with  Jennifer Yoder  ([id number], 10-Feb-1996) on 06/28/2024 at  6:15 PM EST by a video-enabled telemedicine application and verified that I am speaking with the correct person using two identifiers.  Location: Patient: Virtual Visit Location Patient:  Home Provider: Virtual Visit Location Provider: Home Office   I discussed the limitations of evaluation and management by telemedicine and the availability of in person appointments. The patient expressed understanding and agreed to proceed.    History of Present Illness: Jennifer Yoder is a 29 y.o. who identifies as a female who was assigned female at birth, and is being seen today for vomiting and diarrhea.   Headache only yesterday. Woke up today nauseated, vomiting x4, last at 1215pm; and diarrhea x 12+, last time an hour ago. Stool is like brown liquid. No blood or pus or mucus. Has not traveled out of country recently and no known drinking/eating from contaminated sources. No known sick contacts. No abd pain unless cramping when about to have diarrhea or vomit.   Is still making normal amount yellow urine.   Temp 101F. Down to 100.71F now without treatment.   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  Resting comfortably  at home.  Head is normocephalic, atraumatic.  No labored breathing.  Speech is clear and coherent with logical content.  Patient is alert  and oriented at baseline.    Assessment and Plan: 1. Gastroenteritis (Primary) - ondansetron  (ZOFRAN ) 4 MG tablet; Take 1 tablet (4 mg total) by mouth every 8 (eight) hours as needed for nausea or vomiting.  Dispense: 20 tablet; Refill: 0  Focus on staying hydrated, if cannot stay hydrated seek ER care. Imodium for diarrhea.   Follow Up Instructions: I discussed the assessment and treatment plan  with the patient. The patient was provided an opportunity to ask questions and all were answered. The patient agreed with the plan and demonstrated an understanding of the instructions.  A copy of instructions were sent to the patient via MyChart unless otherwise noted below.    The patient was advised to call back or seek an in-person evaluation if the symptoms worsen or if the condition fails to improve as anticipated.    Jon CHRISTELLA Belt, NP    [1] No Known Allergies [2]  Current Outpatient Medications:    ondansetron  (ZOFRAN ) 4 MG tablet, Take 1 tablet (4 mg total) by mouth every 8 (eight) hours as needed for nausea or vomiting., Disp: 20 tablet, Rfl: 0   escitalopram  (LEXAPRO ) 20 MG tablet, Take 1 tablet (20 mg total) by mouth daily., Disp: 90 tablet, Rfl: 3   propranolol  (INDERAL ) 20 MG tablet, Take 1 tablet (20 mg total) by mouth 2 (two) times daily., Disp: 60 tablet, Rfl: 11   rizatriptan  (MAXALT -MLT) 10 MG disintegrating tablet, Take 1 tablet (10 mg total) by mouth as needed for migraine. May repeat in 2 hours if needed, Disp: 9 tablet, Rfl: 11   topiramate  (TOPAMAX ) 100 MG tablet, TAKE 1 TABLET BY MOUTH EVERYDAY AT BEDTIME, Disp: 30 tablet, Rfl: 11  "

## 2024-06-28 NOTE — Patient Instructions (Signed)
" °  Waddell FORBES Hint, thank you for joining Jon CHRISTELLA Belt, NP for today's virtual visit.  While this provider is not your primary care provider (PCP), if your PCP is located in our provider database this encounter information will be shared with them immediately following your visit.   A [facility] MyChart account gives you access to today's visit and all your visits, tests, and labs performed at [facility]  click here if you don't have a [facility] MyChart account or go to [email or web address]  Consent: (Patient) Waddell FORBES Hint provided verbal consent for this virtual visit at the beginning of the encounter.  Current Medications:  Current Outpatient Medications:    ondansetron  (ZOFRAN ) 4 MG tablet, Take 1 tablet (4 mg total) by mouth every 8 (eight) hours as needed for nausea or vomiting., Disp: 20 tablet, Rfl: 0   escitalopram  (LEXAPRO ) 20 MG tablet, Take 1 tablet (20 mg total) by mouth daily., Disp: 90 tablet, Rfl: 3   propranolol  (INDERAL ) 20 MG tablet, Take 1 tablet (20 mg total) by mouth 2 (two) times daily., Disp: 60 tablet, Rfl: 11   rizatriptan  (MAXALT -MLT) 10 MG disintegrating tablet, Take 1 tablet (10 mg total) by mouth as needed for migraine. May repeat in 2 hours if needed, Disp: 9 tablet, Rfl: 11   topiramate  (TOPAMAX ) 100 MG tablet, TAKE 1 TABLET BY MOUTH EVERYDAY AT BEDTIME, Disp: 30 tablet, Rfl: 11   Medications ordered in this encounter:  Meds ordered this encounter  Medications   ondansetron  (ZOFRAN ) 4 MG tablet    Sig: Take 1 tablet (4 mg total) by mouth every 8 (eight) hours as needed for nausea or vomiting.    Dispense:  20 tablet    Refill:  0     *If you need refills on other medications prior to your next appointment, please contact your pharmacy*  Follow-Up: Call back or seek an in-person evaluation if the symptoms worsen or if the condition fails to improve as anticipated.  [facility] [phone]  Other  Instructions  Continue to work on hydration with clear liquids: plain broth, jello, popsicles, 7-up/sprite, water. When you feel much better, ok to try bland solid food like plain crackers or toast, applesauce, bananas and then if it goes well, gradually go back to regular diet.   Purchase Imodium for diarrhea and take according to the package instructions.    If you have been instructed to have an in-person evaluation today at a local Urgent Care facility, please use the link below. It will take you to a list of all of our available [facility], including address, phone number and hours of operation. Please do not delay care.  [facility]  If you or a family member do not have a primary care provider, use the link below to schedule a visit and establish care. When you choose a [facility] primary care physician or advanced practice provider, you gain a long-term partner in health. Find a Primary Care Provider  Learn more about [facility]'s in-office and virtual care options: [facility] - Get Care Now  "

## 2024-06-28 NOTE — Telephone Encounter (Signed)
 FYI Only or Action Required?: FYI only for provider: appointment scheduled on 1/12 [facility] Urgent [facility].  Patient was last seen in primary care on 12/11/2023 by Copland, Harlene BROCKS, MD.  Called Nurse Triage reporting Diarrhea, Vomiting, and Fever.  Symptoms began Today, headache also yesterday.  Interventions attempted: Rest, hydration, or home remedies.  Symptoms are: stable.  Triage Disposition: See Physician Within 24 Hours  Patient/caregiver understands and will follow disposition?: Yes     Copied from CRM #[id number]. Topic: Clinical - Red Word Triage >> Jun 28, 2024  4:16 PM Alfonso HERO wrote: Red Word that prompted transfer to Nurse Triage: vomiting, fever, diarrhea Reason for Disposition  Fever > 101 F (38.3 C)    Fever = 101 but with severe, new onset diarrhea.  Additional Information  Negative: [1] Drinking very little AND [2] dehydration suspected (e.g., no urine > 12 hours, very dry mouth, very lightheaded)    Can keep ice chips down, no dizziness or light-headedness.  Answer Assessment - Initial Assessment Questions 1. DIARRHEA SEVERITY: How bad is the diarrhea? How many more stools have you had in the past 24 hours than normal?      Over a dozen times today  2. ONSET: When did the diarrhea begin?      Started this AM upon waking, while was passing stool started vomiting. Also had a dull headache yesterday.   3. STOOL DESCRIPTION:  How loose or watery is the diarrhea? What is the stool color? Is there any blood or mucous in the stool?     Loose stool  4. VOMITING: Are you also vomiting? If Yes, ask: How many times in the past 24 hours?      3x today  5. ABDOMEN PAIN: Are you having any abdomen pain? If Yes, ask: What does it feel like? (e.g., crampy, dull, intermittent, constant)      Denies  6. ABDOMEN PAIN SEVERITY: If present, ask: How bad is the pain?  (e.g., Scale 1-10; mild, moderate, or severe)     No pain  7. ORAL INTAKE: If vomiting,  Have you been able to drink liquids? How much liquids have you had in the past 24 hours?     Is able to keep ice cubes down but nothing else.  Reports dry mouth, denies dizzy or light-headed.  No decrease in urination reported.  8. HYDRATION: Any signs of dehydration? (e.g., dry mouth [not just dry lips], too weak to stand, dizziness, new weight loss) When did you last urinate?     Always kind of dizzy, takes propanolol for IIH, is common to experience dizziness, no acute dizziness reported today (denies).   9. EXPOSURE: Have you traveled to a foreign country recently? Have you been exposed to anyone with diarrhea? Could you have eaten any food that was spoiled?     Fever started around 3:30 PM today, was 101 F, taking a cool bath during call and is now decreased to 100.66F.  Cannot keep anything down to take medication.   10. ANTIBIOTIC USE: Are you taking antibiotics now or have you taken antibiotics in the past 2 months?       Denies, has been really well this year.   11. OTHER SYMPTOMS: Do you have any other symptoms? (e.g., fever, blood in stool)       Green stool, no black or bloody stools.   Body aches rated 7/10.   12. PREGNANCY: Is there any chance you are pregnant? When was your last menstrual  period?       LMP ready to start, does not relate to period.   Works at [employer]- public exposures.  No know direct exposures. COVID test negative.   Does not have access to at home flu test currently.  Received flu vaccine.  Protocols used: [facility]

## 2024-06-28 NOTE — Telephone Encounter (Signed)
 Telehealth appt scheduled.

## 2024-07-07 ENCOUNTER — Ambulatory Visit: Admitting: Medical

## 2024-07-08 ENCOUNTER — Ambulatory Visit: Admitting: Medical

## 2024-07-08 ENCOUNTER — Other Ambulatory Visit: Payer: Self-pay | Admitting: Adult Health

## 2024-07-08 ENCOUNTER — Encounter: Payer: Self-pay | Admitting: Adult Health

## 2024-07-08 VITALS — BP 106/80 | HR 68 | Temp 97.7°F | Resp 15 | Ht 62.0 in | Wt 242.8 lb

## 2024-07-08 DIAGNOSIS — H6991 Unspecified Eustachian tube disorder, right ear: Secondary | ICD-10-CM

## 2024-07-08 MED ORDER — FLUTICASONE PROPIONATE 50 MCG/ACT NA SUSP: 2.0000 | Freq: Every day | NASAL | 1 refills | Status: AC

## 2024-07-08 NOTE — Progress Notes (Addendum)
" ° °  Subjective:    Patient ID: Jennifer Yoder, female    DOB: 1995/09/21, 29 y.o.   MRN: [id number]  HPI  Jennifer Yoder is a 29 year old female with idiopathic intracranial hypertension who presents with ear popping and pressure.  For the past three days she has had ear popping and pressure described as popping and swooshing sounds, most noticeable when she presses on the side of her face, eats, brushes her teeth, or tilts her head. She has no chronic ear infections. No tmj pain. No tooth pain.  She has idiopathic intracranial hypertension and migraines treated with Topamax  and propranolol  and follows annually with neurology.   She had a fever up to 102F last week, diagnosed as gastroenteritis, lasting from Sunday to Tuesday and resolving with hydration. She currently has no fever, chills, ear pain, nasal congestion, sinus pressure, sneezing, or itchy eyes, and no pain behind the ears.     Review of Systems See hpi    Objective:   Physical Exam   General- No acute distress. Pleasant patient. Neck- Full range of motion, no jvd Lungs- Clear, even and unlabored. Heart- regular rate and rhythm. Neurologic- CNII- XII grossly intact.  Heent-No infection signs. No wax. No tmj pain. No tragus pain  either side.(Remember doing exam and comments were in  AVS. Somehow did not place on exam section?) 07-13-2024. Noted after opened up to place referral.     Assessment & Plan:   Patient Instructions  Right eustachian tube dysfunction No infection signs. No wax. No tmj pain. No tragus pain - Prescribed Flonase  nasal spray, two sprays each nostril daily. - Advised follow-up in one week for symptom resolution.(can send my chart message). if worse pain then recommend in office visit - Consider ENT referral if symptoms persist or worsen.  Follow up as needed   D[place] Maxwell, PA-C  "

## 2024-07-08 NOTE — Patient Instructions (Addendum)
 Right eustachian tube dysfunction No infection signs. No wax. No tmj pain. No tragus pain - Prescribed Flonase  nasal spray, two sprays each nostril daily. - Advised follow-up in one week for symptom resolution.(can send my chart message). if worse pain then recommend in office visit - Consider ENT referral if symptoms persist or worsen.  Follow up as needed

## 2024-07-12 ENCOUNTER — Encounter: Payer: Self-pay | Admitting: Medical

## 2024-07-13 MED ORDER — NEOMYCIN-POLYMYXIN-HC 3.5-10000-1 OT SOLN: 3.0000 [drp] | Freq: Four times a day (QID) | OTIC | 0 refills | Status: AC

## 2024-07-13 MED ORDER — METHYLPREDNISOLONE 4 MG PO TABS: ORAL_TABLET | ORAL | 0 refills | Status: AC

## 2024-07-13 NOTE — Addendum Note (Signed)
 Addended by: DORINA DALLAS DORINA PA-C M on: 07/13/2024 03:42 PM   Modules accepted: Orders

## 2024-07-13 NOTE — Addendum Note (Signed)
 Addended by: DORINA DALLAS DORINA PA-C M on: 07/13/2024 04:31 PM   Modules accepted: Orders

## 2024-07-13 NOTE — Addendum Note (Signed)
 Addended by: DORINA DALLAS DORINA PA-C M on: 07/13/2024 01:07 PM   Modules accepted: Orders

## 2025-03-17 ENCOUNTER — Telehealth: Payer: PRIVATE HEALTH INSURANCE | Admitting: Adult Health
